# Patient Record
Sex: Female | Born: 1990 | Hispanic: No | Marital: Single | Smoking: Former smoker
Health system: Southern US, Community
[De-identification: ages and names within clinical notes are randomized; demographics above are authoritative.]

## PROBLEM LIST (undated history)

## (undated) DIAGNOSIS — G43909 Migraine, unspecified, not intractable, without status migrainosus: Secondary | ICD-10-CM

## (undated) DIAGNOSIS — F329 Major depressive disorder, single episode, unspecified: Secondary | ICD-10-CM

## (undated) HISTORY — DX: Major depressive disorder, single episode, unspecified: F32.9

## (undated) HISTORY — DX: Migraine, unspecified, not intractable, without status migrainosus: G43.909

---
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
## 2012-04-26 ENCOUNTER — Ambulatory Visit (INDEPENDENT_AMBULATORY_CARE_PROVIDER_SITE_OTHER): Payer: BC Managed Care – PPO | Admitting: Family Medicine

## 2012-04-26 ENCOUNTER — Encounter: Payer: Self-pay | Admitting: Family Medicine

## 2012-04-26 VITALS — BP 110/73 | HR 72 | Wt 108.0 lb

## 2012-04-26 DIAGNOSIS — Z0000 Encounter for general adult medical examination without abnormal findings: Secondary | ICD-10-CM

## 2012-04-26 DIAGNOSIS — E049 Nontoxic goiter, unspecified: Secondary | ICD-10-CM

## 2012-04-26 DIAGNOSIS — R5383 Other fatigue: Secondary | ICD-10-CM

## 2012-04-26 DIAGNOSIS — R5381 Other malaise: Secondary | ICD-10-CM

## 2012-04-26 LAB — T3, FREE: T3, Free: 2.8 pg/mL (ref 2.3–4.2)

## 2012-04-26 LAB — COMPLETE METABOLIC PANEL WITH GFR
Albumin: 4.4 g/dL (ref 3.5–5.2)
Alkaline Phosphatase: 30 U/L — ABNORMAL LOW (ref 39–117)
BUN: 9 mg/dL (ref 6–23)
CO2: 23 mEq/L (ref 19–32)
GFR, Est African American: >89 mL/min
GFR, Est Non African American: >89 mL/min
Glucose, Bld: 72 mg/dL (ref 70–99)
Potassium: 4.5 mEq/L (ref 3.5–5.3)
Total Bilirubin: 0.5 mg/dL (ref 0.3–1.2)

## 2012-04-26 LAB — CBC
MCH: 29.3 pg (ref 26.0–34.0)
Platelets: 230 10*3/uL (ref 150–400)
RBC: 4.68 MIL/uL (ref 3.87–5.11)
RDW: 12.9 % (ref 11.5–15.5)

## 2012-04-26 LAB — FERRITIN: Ferritin: 13 ng/mL (ref 10–291)

## 2012-04-26 NOTE — Progress Notes (Signed)
Subjective:    Patient ID: Candice Callahan, female    DOB: 18-Oct-1990, 21 y.o.   MRN: [id number]  HPI Herer for CPE today.    Goes to bed around 11PM. Still sleeps with the TV on.  Sleeping well. Says yawns a lot.  Takes naps most days.  Doesn't feel rested when wakes up. Drinks about 3 cups of caffine a day.  Mom with hx of thyrodi problems.  No heavy period. They are regular.  Not vegetarian.  No snoring.  Sometimes easily irritated but denies feeling depressed.  Not a smoker. Takes abourt a 2 hour nap daily.  No CP or SOb. No palpitations.  No skin or hair changes. Does shed a lot.  No swollen LNs.    Review of Systems comprehesive ROS is neg except for HPI.     BP 110/73  Pulse 72  Wt 108 lb (48.988 kg)  LMP 04/11/2012    No Known Allergies  No past medical history on file.  No past surgical history on file.  History   Social History  . Marital Status: Single    Spouse Name: N/A    Number of Children: N/A  . Years of Education: N/A   Occupational History  . Not on file.   Social History Main Topics  . Smoking status: Never Smoker   . Smokeless tobacco: Not on file  . Alcohol Use: Not on file  . Drug Use: Not on file  . Sexually Active: Not on file   Other Topics Concern  . Not on file   Social History Narrative  . No narrative on file    No family history on file.  Outpatient Encounter Prescriptions as of 04/26/2012  Medication Sig Dispense Refill  . etonogestrel-ethinyl estradiol (NUVARING) 0.12-0.015 MG/24HR vaginal ring Place 1 each vaginally every 28 (twenty-eight) days. Insert vaginally and leave in place for 3 consecutive weeks, then remove for 1 week.      . butalbital-acetaminophen-caffeine (FIORICET) 50-325-40 MG per tablet Take 1-2 tablets by mouth every 6 (six) hours as needed for pain or headache (headache pain can cause sedatioin).  20 tablet  0  . orphenadrine (NORFLEX) 100 MG tablet Take 1 tablet (100 mg total) by mouth 2 (two) times daily as  needed for muscle spasms (mainly take at night since it can cause sedation).  60 tablet  3       Objective:   Physical Exam  Constitutional: She is oriented to person, place, and time. She appears well-developed and well-nourished.  HENT:  Head: Normocephalic and atraumatic.  Right Ear: External ear normal.  Left Ear: External ear normal.  Nose: Nose normal.  Mouth/Throat: Oropharynx is clear and moist.       TMs and canals are clear.   Eyes: Conjunctivae and EOM are normal. Pupils are equal, round, and reactive to light.  Neck: Neck supple. Thyromegaly present.       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  Cardiovascular: Normal rate, regular rhythm and normal heart sounds.   Pulmonary/Chest: Effort normal and breath sounds normal. She has no wheezes.  Abdominal: Soft. Bowel sounds are normal. She exhibits no distension and no mass. There is no tenderness. There is no rebound and no guarding.  Musculoskeletal: She exhibits no edema.  Lymphadenopathy:    She has no cervical adenopathy.  Neurological: She is alert and oriented to person, place, and time. She has normal reflexes. No cranial nerve deficit. Coordination normal.  Skin: Skin is warm and dry.  Psychiatric: She has a normal mood and affect. Her behavior is normal.          Assessment & Plan:  complete physical examination - Start a regular exercise program and make sure you are eating a healthy diet Try to eat 4 servings of dairy a day  Your vaccines are up to date.   Thyromegaly - recheck thyroid panel today. She does have a large thyroid but feels symmetric and I don't palpate any nodules. She also has a family history of hypothyroidism. Mom was diagnosed around age 36. Certainly this could explain her fatigue.  Fatigue - could be related to her thyroid. Recheck thyroid panel. Check CBC to evaluate for anemia though she denies any heavy periods. She is not vegetarian but we will go ahead and check for B12  deficiency. I did recommend cutting back on her caffeine intake and trying to set a regular bedtime and awake time. I reminded her as we have discussed in the past to not use TV to fall asleep.

## 2012-04-26 NOTE — Patient Instructions (Addendum)
We will call you with your lab results. If you don't here from us in about a week then please give us a call at [phone].  

## 2012-04-27 LAB — VITAMIN D 25 HYDROXY (VIT D DEFICIENCY, FRACTURES): Vit D, 25-Hydroxy: 33 ng/mL (ref 30–89)

## 2012-05-02 ENCOUNTER — Encounter: Payer: Self-pay | Admitting: *Deleted

## 2012-09-04 ENCOUNTER — Telehealth: Payer: Self-pay | Admitting: Family Medicine

## 2012-09-04 NOTE — Telephone Encounter (Signed)
Unable to leave a message. Voice mail has not been set up.  

## 2012-09-04 NOTE — Telephone Encounter (Signed)
Patient's mom called and left a voice message and request to have referral for Orthopedic Dr. Vevelyn Royals name is Candice Callahan and request to have a call back regarding (daughter getting a referral). Thanks  Not sure what its in reference to or if you have discussed it in previous visit.

## 2012-09-06 NOTE — Telephone Encounter (Signed)
Called and was unable to leave a message

## 2013-11-19 ENCOUNTER — Ambulatory Visit (INDEPENDENT_AMBULATORY_CARE_PROVIDER_SITE_OTHER): Payer: BC Managed Care – PPO | Admitting: Family Medicine

## 2013-11-19 ENCOUNTER — Encounter: Payer: Self-pay | Admitting: Family Medicine

## 2013-11-19 VITALS — BP 116/65 | HR 87 | Temp 98.3°F | Ht 63.0 in | Wt 107.0 lb

## 2013-11-19 DIAGNOSIS — F32A Depression, unspecified: Secondary | ICD-10-CM

## 2013-11-19 DIAGNOSIS — F329 Major depressive disorder, single episode, unspecified: Secondary | ICD-10-CM

## 2013-11-19 MED ORDER — FLUOXETINE HCL 10 MG PO CAPS: ORAL_CAPSULE | ORAL | Status: DC

## 2013-11-19 NOTE — Progress Notes (Signed)
   Subjective:    Patient ID: Candice Callahan, female    DOB: December 10, 1990, 23 y.o.   MRN: [id number]  HPI  Here to discuss mood.  Says has been a long time since generally happy. Has never been treated for depression. She has had thought of not being her  . Does live with her mom. She is working. No major event or stressor.  No regular exercise.  C/o of feeling down.  Having problems sleeping as well. Has been irritable as well.  Gets annoyed at work.  No fam hx of depression. Never been dx or treated before.  No worsening or alleviating factors.    Review of Systems     Objective:   Physical Exam  Constitutional: She is oriented to person, place, and time. She appears well-developed and well-nourished.  HENT:  Head: Normocephalic and atraumatic.  Cardiovascular: Normal rate, regular rhythm and normal heart sounds.   Pulmonary/Chest: Effort normal and breath sounds normal.  Neurological: She is alert and oriented to person, place, and time.  Skin: Skin is warm and dry.  Psychiatric: She has a normal mood and affect. Her behavior is normal.          Assessment & Plan:  Acute depression - PHQ- 9 score of 10 today. New dx of modera depression. Discussed tx options of counseling, Rx medication, and exercise. She wants to start medication. Will start Prozac. Discussed potential S.E. F/U in 3-4 weeks. Call if any problems.  Consider counseling. She can call if would like referra.

## 2013-12-10 ENCOUNTER — Encounter: Payer: Self-pay | Admitting: Family Medicine

## 2013-12-10 ENCOUNTER — Ambulatory Visit (INDEPENDENT_AMBULATORY_CARE_PROVIDER_SITE_OTHER): Payer: BC Managed Care – PPO | Admitting: Family Medicine

## 2013-12-10 ENCOUNTER — Ambulatory Visit: Payer: BC Managed Care – PPO | Admitting: Family Medicine

## 2013-12-10 VITALS — BP 117/76 | HR 76 | Wt 102.0 lb

## 2013-12-10 DIAGNOSIS — F32A Depression, unspecified: Secondary | ICD-10-CM

## 2013-12-10 DIAGNOSIS — F329 Major depressive disorder, single episode, unspecified: Secondary | ICD-10-CM

## 2013-12-10 HISTORY — DX: Depression, unspecified: F32.A

## 2013-12-10 MED ORDER — FLUOXETINE HCL 20 MG PO CAPS: 20.0000 mg | ORAL_CAPSULE | Freq: Every day | ORAL | Status: DC

## 2013-12-10 NOTE — Progress Notes (Signed)
   Subjective:    Patient ID: Candice Callahan, female    DOB: 10-Jul-1991, 23 y.o.   MRN: [id number]  HPI Depression - doing well on the fluoxetine. Had one back HA initially but none since thme.  Had a couple night didn't sleep well but ok now.  She has been much less irritable at work. Has been sleeping well. He denies feeling significantly down or depressed her weight herself. She's not had any negative side effects of the medication. Still does complain of some fatigue and difficulty concentrating.   Review of Systems     Objective:   Physical Exam  Constitutional: She is oriented to person, place, and time. She appears well-developed and well-nourished.  HENT:  Head: Normocephalic and atraumatic.  Cardiovascular: Normal rate, regular rhythm and normal heart sounds.   Pulmonary/Chest: Effort normal and breath sounds normal.  Neurological: She is alert and oriented to person, place, and time.  Skin: Skin is warm and dry.  Psychiatric: She has a normal mood and affect. Her behavior is normal.          Assessment & Plan:  Depression - continue current regimen. We'll change fluoxetine to 20 mg capsules so she doesn't have to take 2 of the 10 mg capsules. Followup in 2 months to touch base. Recommend that she stay on this regimen for at least 6 months. Call if any palms side effects or concerns. Depression is well-controlled today with a PHQ 9 score of 3.

## 2013-12-11 ENCOUNTER — Ambulatory Visit: Payer: BC Managed Care – PPO | Admitting: Family Medicine

## 2014-03-12 ENCOUNTER — Emergency Department
Admission: EM | Admit: 2014-03-12 | Discharge: 2014-03-12 | Disposition: A | Discharge status: Home / Self Care | Payer: BC Managed Care – PPO | Source: Home / Self Care | Attending: Family Medicine | Admitting: Family Medicine

## 2014-03-12 ENCOUNTER — Encounter: Payer: Self-pay | Admitting: Emergency Medicine

## 2014-03-12 DIAGNOSIS — L02419 Cutaneous abscess of limb, unspecified: Secondary | ICD-10-CM

## 2014-03-12 DIAGNOSIS — T63443A Toxic effect of venom of bees, assault, initial encounter: Secondary | ICD-10-CM

## 2014-03-12 DIAGNOSIS — Z23 Encounter for immunization: Secondary | ICD-10-CM

## 2014-03-12 DIAGNOSIS — L03115 Cellulitis of right lower limb: Secondary | ICD-10-CM

## 2014-03-12 DIAGNOSIS — T6391XA Toxic effect of contact with unspecified venomous animal, accidental (unintentional), initial encounter: Secondary | ICD-10-CM

## 2014-03-12 DIAGNOSIS — L03119 Cellulitis of unspecified part of limb: Secondary | ICD-10-CM

## 2014-03-12 MED ORDER — TETANUS-DIPHTH-ACELL PERTUSSIS 5-2.5-18.5 LF-MCG/0.5 IM SUSP
0.5000 mL | Freq: Once | INTRAMUSCULAR | Status: AC
  Administered 2014-03-12: 0.5 mL via INTRAMUSCULAR

## 2014-03-12 MED ORDER — DOXYCYCLINE HYCLATE 100 MG PO CAPS: 100.0000 mg | ORAL_CAPSULE | Freq: Two times a day (BID) | ORAL | Status: DC

## 2014-03-12 NOTE — Discharge Instructions (Signed)
May continue Benadryl as needed for itching. Return for increased redness, pain, swelling, fever, etc.   Bee, Wasp, or Hornet Sting Your caregiver has diagnosed you as having an insect sting. An insect sting appears as a red lump in the skin that sometimes has a tiny hole in the center, or it may have a stinger in the center of the wound. The most common stings are from wasps, hornets and bees. Individuals have different reactions to insect stings.  A normal reaction may cause pain, swelling, and redness around the sting site.  A localized allergic reaction may cause swelling and redness that extends beyond the sting site.  A large local reaction may continue to develop over the next 12 to 36 hours.  On occasion, the reactions can be severe (anaphylactic reaction). An anaphylactic reaction may cause wheezing; difficulty breathing; chest pain; fainting; raised, itchy, red patches on the skin; a sick feeling to your stomach (nausea); vomiting; cramping; or diarrhea. If you have had an anaphylactic reaction to an insect sting in the past, you are more likely to have one again. HOME CARE INSTRUCTIONS   With bee stings, a small sac of poison is left in the wound. Brushing across this with something such as a credit card, or anything similar, will help remove this and decrease the amount of the reaction. This same procedure will not help a wasp sting as they do not leave behind a stinger and poison sac.  Apply a cold compress for 10 to 20 minutes every hour for 1 to 2 days, depending on severity, to reduce swelling and itching.  To lessen pain, a paste made of water and baking soda may be rubbed on the bite or sting and left on for 5 minutes.  To relieve itching and swelling, you may use take medication or apply medicated creams or lotions as directed.  Only take over-the-counter or prescription medicines for pain, discomfort, or fever as directed by your caregiver.  Wash the sting site daily  with soap and water. Apply antibiotic ointment on the sting site as directed.  If you suffered a severe reaction:  If you did not require hospitalization, an adult will need to stay with you for 24 hours in case the symptoms return.  You may need to wear a medical bracelet or necklace stating the allergy.  You and your family need to learn when and how to use an anaphylaxis kit or epinephrine injection.  If you have had a severe reaction before, always carry your anaphylaxis kit with you. SEEK MEDICAL CARE IF:   None of the above helps within 2 to 3 days.  The area becomes red, warm, tender, and swollen beyond the area of the bite or sting.  You have an oral temperature above 102 F (38.9 C). SEEK IMMEDIATE MEDICAL CARE IF:  You have symptoms of an allergic reaction which are:  Wheezing.  Difficulty breathing.  Chest pain.  Lightheadedness or fainting.  Itchy, raised, red patches on the skin.  Nausea, vomiting, cramping or diarrhea. ANY OF THESE SYMPTOMS MAY REPRESENT A SERIOUS PROBLEM THAT IS AN EMERGENCY. Do not wait to see if the symptoms will go away. Get medical help right away. Call your local emergency services (911 in [place] DO NOT drive yourself to the hospital. MAKE SURE YOU:   Understand these instructions.  Will watch your condition.  Will get help right away if you are not doing well or get worse. Document Released: 08/09/2005 Document Revised: 11/01/2011 Document  Reviewed: 01/24/2010 Exit[employer] Patient Information 2015 [place], [place]. This information is not intended to replace advice given to you by your health care provider. Make sure you discuss any questions you have with your health care provider.

## 2014-03-12 NOTE — ED Provider Notes (Signed)
CSN: [id number]     Arrival date & time 03/12/14  1423 History   First MD Initiated Contact with Patient 03/12/14 1442     Chief Complaint  Patient presents with  . Insect Bite      HPI Comments: Patient was stung by a bee on her right posterior calf 48 hours ago.  Since then she has developed increased redness and swelling, and now has redness extending to her right lateral ankle.  The areas itch but are not painful.  She denies calf pain or tenderness.  No fevers, chills, and sweats.  She has had no wheezing or shortness of breath.  She does not remember her last tetanus shot.  Patient is a 23 y.o. female presenting with trauma. The history is provided by the patient.  Trauma Mechanism of injury: bee sting Injury location: leg Injury location detail: R lower leg Time since incident: 2 days   Current symptoms:      Quality: itching.      Pain timing: constant      Associated symptoms:            Denies chest pain, difficulty breathing, nausea and vomiting.   Relevant PMH:      Tetanus status: out of date   History reviewed. No pertinent past medical history. History reviewed. No pertinent past surgical history. No family history on file. History  Substance Use Topics  . Smoking status: Former Games developer  . Smokeless tobacco: Not on file  . Alcohol Use: Yes   OB History   Grav Para Term Preterm Abortions TAB SAB Ect Mult Living                 Review of Systems  Constitutional: Negative for fever, chills and fatigue.  Cardiovascular: Negative for chest pain.  Gastrointestinal: Negative for nausea and vomiting.  All other systems reviewed and are negative.   Allergies  Review of patient's allergies indicates no known allergies.  Home Medications   Prior to Admission medications   Medication Sig Start Date End Date Taking? Authorizing Provider  doxycycline (VIBRAMYCIN) 100 MG capsule Take 1 capsule (100 mg total) by mouth 2 (two) times daily. Take with food. 03/12/14    Lattie Haw, MD  etonogestrel-ethinyl estradiol (NUVARING) 0.12-0.015 MG/24HR vaginal ring Place 1 each vaginally every 28 (twenty-eight) days. Insert vaginally and leave in place for 3 consecutive weeks, then remove for 1 week.    Historical Provider, MD  FLUoxetine (PROZAC) 20 MG capsule Take 1 capsule (20 mg total) by mouth daily. One a day for 5 days and then 2 a day. 12/10/13   Agapito Games, MD   BP 113/75  Pulse 77  Temp(Src) 98.4 F (36.9 C) (Oral)  Ht 5\' 3"  (1.6 m)  Wt 109 lb (49.442 kg)  BMI 19.31 kg/m2  SpO2 100% Physical Exam  Nursing note and vitals reviewed. Constitutional: She is oriented to person, place, and time. She appears well-developed and well-nourished. No distress.  HENT:  Head: Atraumatic.  Eyes: Conjunctivae are normal. Pupils are equal, round, and reactive to light.  Musculoskeletal:       Right lower leg: She exhibits swelling. She exhibits no tenderness and no edema.       Legs: Right posterior calf has a 6cm diameter nummular erythematous region as noted on diagram.  There is faint erythema extending from the sting site to right lateral ankle.  The area is slightly warm and minimally swollen, but not tender to palpation.  No calf tenderness is present, and Homan's test is negative.   Neurological: She is alert and oriented to person, place, and time.  Skin: Skin is warm and dry.    ED Course  Procedures  none      MDM   1. Bee sting, assault, initial encounter   2. Cellulitis of leg, right    Tdap administered Begin doxycycline. May continue Benadryl as needed for itching. Return for increased redness, pain, swelling, fever, etc. Followup with Family Doctor if not improved in about 3 days.    Lattie HawStephen A Beese, MD 03/12/14 [phone]

## 2014-03-12 NOTE — ED Notes (Signed)
Bee sting on right lower leg, cellulitis, red, swollen, itches and feels like a bruise all the way down to her foot x 3 days

## 2014-03-15 ENCOUNTER — Telehealth: Payer: Self-pay

## 2014-03-15 NOTE — ED Notes (Signed)
Left a message on voice mail asking how patient is feeling and advising to call back with any questions or concerns.  

## 2014-05-07 ENCOUNTER — Encounter: Payer: Self-pay | Admitting: Family Medicine

## 2014-05-07 ENCOUNTER — Ambulatory Visit (INDEPENDENT_AMBULATORY_CARE_PROVIDER_SITE_OTHER): Payer: BC Managed Care – PPO | Admitting: Family Medicine

## 2014-05-07 VITALS — BP 116/89 | HR 89 | Wt 109.0 lb

## 2014-05-07 DIAGNOSIS — R41840 Attention and concentration deficit: Secondary | ICD-10-CM | POA: Diagnosis not present

## 2014-05-07 DIAGNOSIS — F3289 Other specified depressive episodes: Secondary | ICD-10-CM | POA: Diagnosis not present

## 2014-05-07 DIAGNOSIS — F329 Major depressive disorder, single episode, unspecified: Secondary | ICD-10-CM

## 2014-05-07 DIAGNOSIS — F32A Depression, unspecified: Secondary | ICD-10-CM

## 2014-05-07 MED ORDER — FLUOXETINE HCL 40 MG PO CAPS: 40.0000 mg | ORAL_CAPSULE | Freq: Every day | ORAL | Status: DC

## 2014-05-07 NOTE — Progress Notes (Signed)
   Subjective:    Patient ID: Candice Callahan, female    DOB: [birth year], 23 y.o.   MRN: [id number]  HPI Here for depression.  She's here today to followup for depression. She feels like the fluoxetine worked well initially but feels like some of the effects have waned. She's currently taking 20 mg daily. She denies any side effects of the medication. She has been struggling some with attentiveness during her classes. She complains of feeling tired and having difficulty concentrating. She does feel down and depressed several days. Negative for all other symptoms.  Feels like having problems with inattention. She is in college classes and is really strugling. Even at work will forget to do things she just got asked to do.  She feels like she is struggling with attention most of her life. She denies any significant hyperactivity symptoms. Her grades are typically average. She says she will often do well on sure assignments but when it comes to testing she tends to not do well.   Review of Systems     Objective:   Physical Exam  Constitutional: She is oriented to person, place, and time. She appears well-developed and well-nourished.  HENT:  Head: Normocephalic and atraumatic.  Cardiovascular: Normal rate, regular rhythm and normal heart sounds.   Pulmonary/Chest: Effort normal and breath sounds normal.  Neurological: She is alert and oriented to person, place, and time.  Skin: Skin is warm and dry.  Psychiatric: She has a normal mood and affect. Her behavior is normal.          Assessment & Plan:  Depression - previous PHQ 9 score of 3 is up to 5. We discussed different options. Would like to try increasing her fluoxetine to 40 mg. She has any side effects or problems please calls back. Otherwise followup in 3 months.  Inattention - I. did have her do an adult ADHD RS adhd-rs-IV pt scored equally in the moderate and severe inattentive symptomes and none in the hyperactive impulsive  symptoms. This is significant enough that I would recommend further evaluation. We'll place a referral to be evaluated by behavioral health over at [facility] for adult ADD. She screened positive then I will see her back and we discussed can discuss therapeutic and treatment options.

## 2014-05-31 ENCOUNTER — Ambulatory Visit (INDEPENDENT_AMBULATORY_CARE_PROVIDER_SITE_OTHER): Payer: BC Managed Care – PPO | Admitting: Psychology

## 2014-05-31 DIAGNOSIS — F320 Major depressive disorder, single episode, mild: Secondary | ICD-10-CM

## 2014-05-31 DIAGNOSIS — F900 Attention-deficit hyperactivity disorder, predominantly inattentive type: Secondary | ICD-10-CM

## 2014-06-14 ENCOUNTER — Ambulatory Visit (INDEPENDENT_AMBULATORY_CARE_PROVIDER_SITE_OTHER): Payer: BC Managed Care – PPO | Admitting: Psychology

## 2014-06-14 DIAGNOSIS — F900 Attention-deficit hyperactivity disorder, predominantly inattentive type: Secondary | ICD-10-CM

## 2014-06-14 DIAGNOSIS — F332 Major depressive disorder, recurrent severe without psychotic features: Secondary | ICD-10-CM

## 2014-06-19 ENCOUNTER — Ambulatory Visit (INDEPENDENT_AMBULATORY_CARE_PROVIDER_SITE_OTHER): Payer: BC Managed Care – PPO | Admitting: Psychology

## 2014-06-19 DIAGNOSIS — F900 Attention-deficit hyperactivity disorder, predominantly inattentive type: Secondary | ICD-10-CM

## 2014-06-19 DIAGNOSIS — F320 Major depressive disorder, single episode, mild: Secondary | ICD-10-CM

## 2014-07-03 ENCOUNTER — Ambulatory Visit (INDEPENDENT_AMBULATORY_CARE_PROVIDER_SITE_OTHER): Payer: BC Managed Care – PPO | Admitting: Psychology

## 2014-07-03 DIAGNOSIS — F330 Major depressive disorder, recurrent, mild: Secondary | ICD-10-CM

## 2014-08-03 ENCOUNTER — Other Ambulatory Visit: Payer: Self-pay | Admitting: Family Medicine

## 2014-08-05 NOTE — Telephone Encounter (Signed)
Appointment needed before future refills 

## 2014-08-30 ENCOUNTER — Other Ambulatory Visit: Payer: Self-pay | Admitting: Family Medicine

## 2014-09-30 ENCOUNTER — Other Ambulatory Visit: Payer: Self-pay | Admitting: Family Medicine

## 2014-10-26 ENCOUNTER — Other Ambulatory Visit: Payer: Self-pay | Admitting: Family Medicine

## 2014-11-10 ENCOUNTER — Other Ambulatory Visit: Payer: Self-pay | Admitting: Family Medicine

## 2014-11-11 ENCOUNTER — Encounter: Payer: Self-pay | Admitting: Family Medicine

## 2014-11-11 ENCOUNTER — Ambulatory Visit (INDEPENDENT_AMBULATORY_CARE_PROVIDER_SITE_OTHER): Payer: Self-pay | Admitting: Family Medicine

## 2014-11-11 VITALS — BP 116/76 | HR 78 | Ht 63.0 in | Wt 123.0 lb

## 2014-11-11 DIAGNOSIS — F329 Major depressive disorder, single episode, unspecified: Secondary | ICD-10-CM

## 2014-11-11 DIAGNOSIS — F32A Depression, unspecified: Secondary | ICD-10-CM

## 2014-11-11 MED ORDER — FLUOXETINE HCL 40 MG PO CAPS: ORAL_CAPSULE | ORAL | Status: DC

## 2014-11-11 NOTE — Progress Notes (Signed)
   Subjective:    Patient ID: Candice Callahan, female    DOB: 05-14-1991, [age] y.o.   MRN: [id number]  HPI Follow-up depression-patient only complains of low energy today. Denies symptoms of feeling down or little interest in doing things. She was started on fluoxetine exactly a year ago in March 2015. She was eventually titrated to 40 mg in December.  Sleep quality is good.  Feels like her stress level might go up  bc she got a promotion. No negative effects.    Review of Systems     Objective:   Physical Exam  Constitutional: She is oriented to person, place, and time. She appears well-developed and well-nourished.  HENT:  Head: Normocephalic and atraumatic.  Cardiovascular: Normal rate, regular rhythm and normal heart sounds.   Pulmonary/Chest: Effort normal and breath sounds normal.  Neurological: She is alert and oriented to person, place, and time.  Skin: Skin is warm and dry.  Psychiatric: She has a normal mood and affect. Her behavior is normal.          Assessment & Plan:  Depression-previous PHQ 9 score 5, down to 1 today. She rates her symptoms as not difficult at all.  We discussed continuing versus weaning since has been on it for a year.  She prefers to stay on the medication her current regimen. I'll see her back in 6 months. Certainly if at some point she is willing to come off her taper and she can always come in sooner.

## 2015-05-19 ENCOUNTER — Ambulatory Visit (INDEPENDENT_AMBULATORY_CARE_PROVIDER_SITE_OTHER): Payer: BLUE CROSS/BLUE SHIELD | Admitting: Family Medicine

## 2015-05-19 ENCOUNTER — Encounter: Payer: Self-pay | Admitting: Family Medicine

## 2015-05-19 VITALS — BP 104/54 | HR 56 | Ht 63.0 in | Wt 129.0 lb

## 2015-05-19 DIAGNOSIS — F324 Major depressive disorder, single episode, in partial remission: Secondary | ICD-10-CM

## 2015-05-19 DIAGNOSIS — F325 Major depressive disorder, single episode, in full remission: Secondary | ICD-10-CM

## 2015-05-19 MED ORDER — FLUOXETINE HCL 10 MG PO CAPS: ORAL_CAPSULE | ORAL | Status: DC

## 2015-05-19 NOTE — Progress Notes (Signed)
   Subjective:    Patient ID: Candice Callahan, female    DOB: [birth year], 24 y.o.   MRN: [id number]  HPI She is here today to follow-up on depression. She is doing fantastic. She is now working 2 jobs. She denies any depressive symptoms. She does complain of a little bit of low energy. Mood has been great. She's tolerating the fluoxetine well and is currently taking 40 mg daily. We first started treating her in March 2015.   Review of Systems     Objective:   Physical Exam  Constitutional: She is oriented to person, place, and time. She appears well-developed and well-nourished.  HENT:  Head: Normocephalic and atraumatic.  Eyes: Conjunctivae and EOM are normal.  Cardiovascular: Normal rate.   Pulmonary/Chest: Effort normal.  Neurological: She is alert and oriented to person, place, and time.  Skin: Skin is dry. No pallor.  Psychiatric: She has a normal mood and affect. Her behavior is normal.          Assessment & Plan:  Depression, in remission-she's done fantastic. We will taper her off the fluoxetine over the next 3 weeks. Did encourage her to call at any point if she feels like she's not doing well on the taper. We cannot extend it out longer if needed. Or we can also keep her on a lower dose if needed.  Time spent 20 min, > 50% spent counseling about depression and cessation of medication.

## 2015-07-19 ENCOUNTER — Other Ambulatory Visit: Payer: Self-pay | Admitting: Family Medicine

## 2015-07-22 ENCOUNTER — Other Ambulatory Visit: Payer: Self-pay

## 2015-07-22 MED ORDER — FLUOXETINE HCL 10 MG PO CAPS: ORAL_CAPSULE | ORAL | Status: DC

## 2015-10-30 ENCOUNTER — Encounter: Payer: Self-pay | Admitting: Family Medicine

## 2015-10-30 ENCOUNTER — Ambulatory Visit (INDEPENDENT_AMBULATORY_CARE_PROVIDER_SITE_OTHER): Payer: BLUE CROSS/BLUE SHIELD | Admitting: Family Medicine

## 2015-10-30 VITALS — BP 105/68 | HR 73 | Temp 98.5°F | Wt 118.0 lb

## 2015-10-30 DIAGNOSIS — R509 Fever, unspecified: Secondary | ICD-10-CM

## 2015-10-30 DIAGNOSIS — M542 Cervicalgia: Secondary | ICD-10-CM

## 2015-10-30 MED ORDER — SUMATRIPTAN SUCCINATE 100 MG PO TABS: 100.0000 mg | ORAL_TABLET | ORAL | Status: DC | PRN

## 2015-10-30 NOTE — Progress Notes (Signed)
   Subjective:    Patient ID: Candice Callahan, female    DOB: 1990-10-23, 25 y.o.   MRN: [id number]  HPI  She woke up Sunday morning not feeling well. She felt like she had some pain in her upper back and her neck. She started running low-grade fevers. The highest fever she had was 101 and her last fever was yesterday. Today she says she actually feels better. Her neck and back feel better and she has not run a temperature yet today. She denies any cough sinus congestion, urinary symptoms, or lower back pain. No GI symptoms.  She also wants to discuss some upper neck pain. She says it's actually been going on for years. It seems to be episodic and can happen once or twice per month. It typically comes on suddenly. She gets pain at the base of the skull typically on the right side but occasionally can be on the left. She says it so intense when it happens it sometimes she actually has to leave work and sometimes she actually gets nauseated and vomits. No significant light sensitivity or sound sensitivity.. She says sometimes it does provide some relief. Typically the headache will last about a day. Rarely it will last 2-3 days at a time.  Review of Systems     Objective:   Physical Exam  Constitutional: She is oriented to person, place, and time. She appears well-developed and well-nourished.  HENT:  Head: Normocephalic and atraumatic.  Right Ear: External ear normal.  Left Ear: External ear normal.  Nose: Nose normal.  Mouth/Throat: Oropharynx is clear and moist.  TMs and canals are clear.   Eyes: Conjunctivae and EOM are normal. Pupils are equal, round, and reactive to light.  Neck: Neck supple. No thyromegaly present.  Cardiovascular: Normal rate, regular rhythm and normal heart sounds.   Pulmonary/Chest: Effort normal and breath sounds normal. She has no wheezes.  Abdominal: Soft. Bowel sounds are normal. She exhibits no distension and no mass. There is no tenderness. There is no rebound  and no guarding.  Musculoskeletal:  Cervical spine with normal flexion extension rotation right and left and side bending. Nontender over the cervical spine and nontender over the paraspinous muscles.  Lymphadenopathy:    She has no cervical adenopathy.  Neurological: She is alert and oriented to person, place, and time.  Skin: Skin is warm and dry.  Psychiatric: She has a normal mood and affect.          Assessment & Plan:  Fever-unclear etiology. Most likely viral but she never really expressing upper respiratory symptoms. She is feeling better today and has a completely normal physical exam. I did go ahead and give her a lab slip in case she starts to feel bad again in the next report a 48 hours she can go the lab and we can at least check a CBC, kidney function liver enzymes and a urinalysis. Consider chest x-ray if fever recurs as well.  Upper neck pain-relieving by her description sounds more consistent with migraine headaches. I would like to try a triptan to see if it's helpful and if it resolves her symptoms and works better than the Advil. Follow-up in 2-3 months.

## 2015-10-30 NOTE — Patient Instructions (Signed)

## 2015-11-10 ENCOUNTER — Telehealth: Payer: Self-pay

## 2015-11-10 MED ORDER — RIZATRIPTAN BENZOATE 10 MG PO TBDP: 10.0000 mg | ORAL_TABLET | ORAL | Status: DC | PRN

## 2015-11-10 NOTE — Telephone Encounter (Signed)
We can try maxalt. New rx sent.

## 2015-11-10 NOTE — Telephone Encounter (Signed)
Notified pt. 

## 2017-06-13 DIAGNOSIS — Z975 Presence of (intrauterine) contraceptive device: Secondary | ICD-10-CM | POA: Diagnosis not present

## 2017-06-13 DIAGNOSIS — Z803 Family history of malignant neoplasm of breast: Secondary | ICD-10-CM | POA: Diagnosis not present

## 2017-06-13 DIAGNOSIS — Z01419 Encounter for gynecological examination (general) (routine) without abnormal findings: Secondary | ICD-10-CM | POA: Diagnosis not present

## 2017-06-24 DIAGNOSIS — Z30430 Encounter for insertion of intrauterine contraceptive device: Secondary | ICD-10-CM | POA: Diagnosis not present

## 2017-06-24 DIAGNOSIS — Z30433 Encounter for removal and reinsertion of intrauterine contraceptive device: Secondary | ICD-10-CM | POA: Diagnosis not present

## 2017-07-07 ENCOUNTER — Ambulatory Visit: Payer: BLUE CROSS/BLUE SHIELD | Admitting: Osteopathic Medicine

## 2017-07-07 VITALS — BP 117/78 | HR 73 | Temp 98.4°F | Ht 63.0 in | Wt 106.0 lb

## 2017-07-07 DIAGNOSIS — R8290 Unspecified abnormal findings in urine: Secondary | ICD-10-CM

## 2017-07-07 DIAGNOSIS — M549 Dorsalgia, unspecified: Secondary | ICD-10-CM | POA: Diagnosis not present

## 2017-07-07 LAB — POCT URINALYSIS DIPSTICK
GLUCOSE UA: NEGATIVE
Leukocytes, UA: NEGATIVE
NITRITE UA: NEGATIVE
Spec Grav, UA: >=1.03 — AB (ref 1.010–1.025)
UROBILINOGEN UA: 0.2 U/dL
pH, UA: 5.5 (ref 5.0–8.0)

## 2017-07-07 NOTE — Progress Notes (Signed)
HPI: Candice Callahan is a 26 y.o. female who  has no past medical history on file.  [facility], 07/07/17,  for chief complaint of:  Chief Complaint  Patient presents with  . Back Pain    Kyleena put in recently 06/24/17, since then back soreness low back on and off, no vaginal bleeding at this point, reports orange colored urine without frequency or dysuria. No concern for dehydration. She called her OBGYN there was some suggestion might be kidney stones or urinary issues.    Past medical, surgical, social and family history reviewed:  Patient Active Problem List   Diagnosis Date Noted  . Acute depression 12/10/2013  . THYROMEGALY 12/09/2008  . NECK PAIN 12/09/2008  . HEADACHE 12/09/2008    No past surgical history on file.  Social History   Tobacco Use  . Smoking status: Former Smoker  Substance Use Topics  . Alcohol use: Yes    No family history on file.   Current medication list and allergy/intolerance information reviewed:    Current Outpatient Medications  Medication Sig Dispense Refill  . Levonorgestrel (KYLEENA) 19.5 MG IUD by Intrauterine route.    . rizatriptan (MAXALT-MLT) 10 MG disintegrating tablet Take 1 tablet (10 mg total) by mouth as needed for migraine. May repeat in 2 hours if needed 9 tablet 1   No current facility-administered medications for this visit.     No Known Allergies    Review of Systems:  Constitutional:  No  fever, no chills, No recent illness, No unintentional weight changes. No significant fatigue.   Cardiac: No  chest pain,  Respiratory:  No  shortness of breath  Gastrointestinal: No  abdominal pain, No  nausea, No  vomiting,  No  blood in stool, No  diarrhea, No  constipation   Musculoskeletal: No new myalgia/arthralgia, +back pain as per HPI  Genitourinary: No  incontinence, No  abnormal genital bleeding, No abnormal genital discharge  Skin: No  Rash  Neurologic:  No  weakness, No  dizziness  Psychiatric: No  concerns with depression, No  concerns with anxiety, No sleep problems, No mood problems  Exam:  BP 117/78   Pulse 73   Temp 98.4 F (36.9 C) (Oral)   Ht 5\' 3"  (1.6 m)   Wt 106 lb (48.1 kg)   BMI 18.78 kg/m b  Constitutional: VS see above. General Appearance: alert, well-developed, well-nourished, NAD  Eyes: Normal lids and conjunctive, non-icteric sclera  Ears, Nose, Mouth, Throat: MMM, Normal external inspection ears/nares/mouth/lips/gums.    Neck: No masses, trachea midline.  Respiratory: Normal respiratory effort.   Gastrointestinal: Nontender, no masses. No hepatomegaly, no splenomegaly. No hernia appreciated. Bowel sounds normal. Rectal exam deferred.   Musculoskeletal: Gait normal. No clubbing/cyanosis of digits. Neg [place]oy[place] side.   Neurological: Normal balance/coordination. No tremor.   Skin: warm, dry, intact. No rash/ulcer.   Psychiatric: Normal judgment/insight. Normal mood and affect. Oriented x3.   I looked at urine sample in the lab - not really orange or substantially dark but a bit more concentrated appearing than normal    Results for orders placed or performed in visit on 07/07/17 (from the past 72 hour(s))  POCT Urinalysis Dipstick     Status: Abnormal   Collection Time: 07/07/17  1:59 PM  Result Value Ref Range   Color, UA yellow    Clarity, UA clear    Glucose, UA negative    Bilirubin, UA small    Ketones,  UA 15mg     Spec Grav, UA >=1.030 (A) 1.010 - 1.025   Blood, UA trace    pH, UA 5.5 5.0 - 8.0   Protein, UA trace    Urobilinogen, UA 0.2 0.2 or 1.0 E.U./dL   Nitrite, UA negative    Leukocytes, UA Negative Negative  Urine Microscopic     Status: Abnormal   Collection Time: 07/07/17  1:59 PM  Result Value Ref Range   WBC, UA 0-5 0 - 5 /HPF   RBC / HPF NONE SEEN 0 - 2 /HPF   Squamous Epithelial / LPF 10-20 (A) < OR = 5 /HPF   Bacteria, UA FEW (A) NONE SEEN /HPF   Hyaline Cast NONE SEEN NONE  SEEN /LPF    No results found.   ASSESSMENT/PLAN: Urine looks ok to me, absence of other symptoms of UTI or concerning back pain, likely viscerosomatic reflex of uterine cramps as IUD settles, be sure to keep follow-up w/ GYN, no vaginal bleeding or lower abdominal pain to suggest uterine perforation  Back pain, unspecified back location, unspecified back pain laterality, unspecified chronicity - Plan: POCT Urinalysis Dipstick  Urine abnormality - Plan: Urine Culture, Urine Microscopic   ADDENDUM 07/08/17 8:48 AM  Called patient - I had a thought about her case and wanted to follow up. I was unable to reach her so I left a voicemail to return clinic call for follow-up question.   I doubt that her pain would be due to a uterine perforation of the IUD, but I should have thought to do a pelvic exam yesterday to check the strings. If she calls back, we can double book her on my schedule today or have her see Vinetta BergamoCharley later in the afternoon as I am out of the office on Friday afternoons. Advised her to call clinic back at our main number.     Visit summary with medication list and pertinent instructions was printed for patient to review. All questions at time of visit were answered - patient instructed to contact office with any additional concerns. ER/RTC precautions were reviewed with the patient. Follow-up plan: Return if symptoms worsen or fail to improve.  Note: Total time spent 25 minutes, greater than 50% of the visit was spent face-to-face counseling and coordinating care for the following: The encounter diagnosis was Back pain, unspecified back location, unspecified back pain laterality, unspecified chronicity..  Please note: voice recognition software was used to produce this document, and typos may escape review. Please contact Dr. Lyn HollingsheadAlexander for any needed clarifications.

## 2017-07-08 ENCOUNTER — Telehealth: Payer: Self-pay | Admitting: Osteopathic Medicine

## 2017-07-08 LAB — URINE CULTURE
MICRO NUMBER: 81290703
Result:: NO GROWTH
SPECIMEN QUALITY: ADEQUATE

## 2017-07-08 LAB — URINALYSIS, MICROSCOPIC ONLY
Hyaline Cast: NONE SEEN /LPF
RBC / HPF: NONE SEEN /HPF (ref 0–2)

## 2017-07-08 NOTE — Telephone Encounter (Signed)
FYI to triage if this patient calls back: She was seen by me yesterday  Called patient - I had a thought about her symptoms and wanted to follow up. I was unable to reach her so I left a voicemail to return clinic call for follow-up question.   I doubt that her pain would be due to a uterine perforation of the IUD, but I should have thought to do a pelvic exam yesterday to check the strings. If she calls back, we can double book her on my schedule today or have her see someone later in the afternoon as I am out of the office on Friday afternoons - would be just a quick pelvic exam to check IUD strings, I think it would be reasonable to double book if needed but check with provider in PM if needed. Advised her to call clinic back at our main number.

## 2017-07-27 DIAGNOSIS — Z30431 Encounter for routine checking of intrauterine contraceptive device: Secondary | ICD-10-CM | POA: Diagnosis not present

## 2018-01-23 ENCOUNTER — Ambulatory Visit (INDEPENDENT_AMBULATORY_CARE_PROVIDER_SITE_OTHER): Payer: BLUE CROSS/BLUE SHIELD | Admitting: Family Medicine

## 2018-01-23 ENCOUNTER — Encounter: Payer: Self-pay | Admitting: Family Medicine

## 2018-01-23 VITALS — BP 112/75 | HR 84 | Ht 63.0 in | Wt 112.0 lb

## 2018-01-23 DIAGNOSIS — F430 Acute stress reaction: Secondary | ICD-10-CM

## 2018-01-23 DIAGNOSIS — E010 Iodine-deficiency related diffuse (endemic) goiter: Secondary | ICD-10-CM

## 2018-01-23 DIAGNOSIS — G43909 Migraine, unspecified, not intractable, without status migrainosus: Secondary | ICD-10-CM | POA: Diagnosis not present

## 2018-01-23 MED ORDER — RIZATRIPTAN BENZOATE 10 MG PO TBDP: 10.0000 mg | ORAL_TABLET | ORAL | 11 refills | Status: DC | PRN

## 2018-01-23 MED ORDER — TOPIRAMATE 25 MG PO TABS: 25.0000 mg | ORAL_TABLET | Freq: Every day | ORAL | 1 refills | Status: DC

## 2018-01-23 NOTE — Patient Instructions (Signed)
You can try a smart phone app to help you with medication reminders.

## 2018-01-23 NOTE — Progress Notes (Signed)
Subjective:    Patient ID: Candice Callahan, female    DOB: [birth year], 27 y.o.   MRN: [id number]  HPI  She is here today because she is had some personal issues going on.  She says she recently ended her friendship with her best friend and feels like partly it was her fault.  She would like to see and work with a therapist here in [place] who is female preferably.  He recently tried to restart fluoxetine and had her OB/GYN send it into the pharmacy but admits she was not taking it consistently so ended up to stopping it.  She had taken in the past and had done well with it.  Migraine headaches-she is been having headaches about 10 out of 30 days/month.  Sometimes she gets extremely nauseated and actually vomits.  She has used Maxalt in the past and that works well except for when she wakes up with a migraine headache.  Those she usually is not able to get control of and sometimes will miss work.  She says usually the best treatment is to just go home and sleep off the headache if she is able to.  Is really never been on prophylaxis.   Review of Systems  BP 112/75   Pulse 84   Ht 5\' 3"  (1.6 m)   Wt 112 lb (50.8 kg)   SpO2 100%   BMI 19.84 kg/m     No Known Allergies  No past medical history on file.  No past surgical history on file.  Social History   Socioeconomic History  . Marital status: Single    Spouse name: Not on file  . Number of children: Not on file  . Years of education: Not on file  . Highest education level: Not on file  Occupational History  . Not on file  Social Needs  . Financial resource strain: Not on file  . Food insecurity:    Worry: Not on file    Inability: Not on file  . Transportation needs:    Medical: Not on file    Non-medical: Not on file  Tobacco Use  . Smoking status: Former Games developer  . Smokeless tobacco: Never Used  Substance and Sexual Activity  . Alcohol use: Yes  . Drug use: Not on file  . Sexual activity: Not on file  Lifestyle   . Physical activity:    Days per week: Not on file    Minutes per session: Not on file  . Stress: Not on file  Relationships  . Social connections:    Talks on phone: Not on file    Gets together: Not on file    Attends religious service: Not on file    Active member of club or organization: Not on file    Attends meetings of clubs or organizations: Not on file    Relationship status: Not on file  . Intimate partner violence:    Fear of current or ex partner: Not on file    Emotionally abused: Not on file    Physically abused: Not on file    Forced sexual activity: Not on file  Other Topics Concern  . Not on file  Social History Narrative  . Not on file    No family history on file.  Outpatient Encounter Medications as of 01/23/2018  Medication Sig  . Levonorgestrel (KYLEENA) 19.5 MG IUD by Intrauterine route.  . rizatriptan (MAXALT-MLT) 10 MG disintegrating tablet Take 1 tablet (10 mg total) by mouth  as needed for migraine. May repeat in 2 hours if needed  . topiramate (TOPAMAX) 25 MG tablet Take 1 tablet (25 mg total) by mouth at bedtime. X 1 week, then increase to BID.  . [DISCONTINUED] rizatriptan (MAXALT-MLT) 10 MG disintegrating tablet Take 1 tablet (10 mg total) by mouth as needed for migraine. May repeat in 2 hours if needed (Patient not taking: Reported on 01/23/2018)   No facility-administered encounter medications on file as of 01/23/2018.          Objective:   Physical Exam  Constitutional: She is oriented to person, place, and time. She appears well-developed and well-nourished.  HENT:  Head: Normocephalic and atraumatic.  Mild thyromegaly on exam.  Cardiovascular: Normal rate, regular rhythm and normal heart sounds.  Pulmonary/Chest: Effort normal and breath sounds normal.  Neurological: She is alert and oriented to person, place, and time.  Skin: Skin is warm and dry.  Psychiatric: She has a normal mood and affect. Her behavior is normal.        Assessment & Plan:  Acute distress-we will refer to therapist/sling.  If she would like to continue the fluoxetine  she might want to consider downloading a smart phone app that helps her remember to take her medications.  Migraine headaches-discussed options.  She is experiencing quite frequent headaches to the point that I really think she should consider prophylaxis.  We discussed different options.  Would like to start with Topamax.  Warned about decreased appetite with the medication and numbness and tingling in the fingertips.  We will also refill her Maxalt.  She can also try taking it with Aleve if needed for improved effect.  Mild thyromegaly-we will check TSH as well as thyroid ultrasound.

## 2018-01-24 LAB — T4, FREE: Free T4: 1.1 ng/dL (ref 0.8–1.8)

## 2018-01-24 LAB — THYROID PEROXIDASE ANTIBODY: Thyroperoxidase Ab SerPl-aCnc: <1 IU/mL (ref <9)

## 2018-01-24 LAB — TSH: TSH: 0.64 m[IU]/L

## 2018-03-20 ENCOUNTER — Telehealth: Payer: Self-pay

## 2018-03-20 NOTE — Telephone Encounter (Signed)
Pt called to check and see if her referral was done for counseling. I verified with referral coordinator that this was done and called pt back and left her a VM advising referral was sent and gave her the phone number to contact them.

## 2018-03-27 ENCOUNTER — Encounter: Payer: Self-pay | Admitting: Family Medicine

## 2018-03-27 ENCOUNTER — Ambulatory Visit (INDEPENDENT_AMBULATORY_CARE_PROVIDER_SITE_OTHER): Payer: BLUE CROSS/BLUE SHIELD | Admitting: Family Medicine

## 2018-03-27 ENCOUNTER — Ambulatory Visit (INDEPENDENT_AMBULATORY_CARE_PROVIDER_SITE_OTHER): Payer: BLUE CROSS/BLUE SHIELD

## 2018-03-27 VITALS — BP 111/64 | HR 67 | Ht 63.0 in | Wt 118.0 lb

## 2018-03-27 DIAGNOSIS — F430 Acute stress reaction: Secondary | ICD-10-CM

## 2018-03-27 DIAGNOSIS — E010 Iodine-deficiency related diffuse (endemic) goiter: Secondary | ICD-10-CM

## 2018-03-27 DIAGNOSIS — G43909 Migraine, unspecified, not intractable, without status migrainosus: Secondary | ICD-10-CM

## 2018-03-27 MED ORDER — TOPIRAMATE 25 MG PO TABS: 25.0000 mg | ORAL_TABLET | Freq: Two times a day (BID) | ORAL | 1 refills | Status: DC

## 2018-03-27 NOTE — Progress Notes (Signed)
   Subjective:    Patient ID: Candice Callahan, female    DOB: [birth year], 27 y.o.   MRN: [id number]  HPI 27 year old female comes in today to follow-up for acute stress.  She has been taking the fluoxetine as prescribed by her OB/GYN though has not refilled it recently because she had a hard time getting to the pharmacy during open hours.  But she plans to stay on it and she did download a smart phone app to help her remember to take her medication regularly.  She has not had any significant side effects.  We did refer her for counseling but unfortunately [facility] did not take her insurance and so she has not been seen by anybody yet.  Follow-up migraine headaches-she is been taking the topiramate and says it has really made a big difference in the frequency and intensity of headaches.  She is also been using the Maxalt for rescue and that has worked effectively all but once.  She woke up with a headache one morning and took her Maxalt but it did not seem to relieve her headache she ended up going to work and ended up sending her home but otherwise she is doing well.   Review of Systems     Objective:   Physical Exam  Constitutional: She is oriented to person, place, and time. She appears well-developed and well-nourished.  HENT:  Head: Normocephalic and atraumatic.  Cardiovascular: Normal rate, regular rhythm and normal heart sounds.  Pulmonary/Chest: Effort normal and breath sounds normal.  Neurological: She is alert and oriented to person, place, and time.  Skin: Skin is warm and dry.  Psychiatric: She has a normal mood and affect. Her behavior is normal.        Assessment & Plan:  Acute stress -did encourage her to stick with the fluoxetine for now.  She thinks she still has refills on it but if not I will be happy to send over new prescription she can let me know.  We will try to get her scheduled for counseling downstairs at our behavioral health location she just needs  therapy and does not need medication management.   Migaine HAs -  Continue with topiramate and as needed Maxalt.  We can always adjust her Pap topiramate up to pending on the frequency of her migraines.  She will continue the current regimen for now and I will see her back in January.  She can only see me in my chart note if she wants to increase the topiramate dose.

## 2018-03-29 ENCOUNTER — Telehealth: Payer: Self-pay | Admitting: Family Medicine

## 2018-03-29 DIAGNOSIS — E010 Iodine-deficiency related diffuse (endemic) goiter: Secondary | ICD-10-CM

## 2018-03-29 NOTE — Telephone Encounter (Signed)
Labs ordered per US result note. Pt advised.

## 2018-04-10 ENCOUNTER — Ambulatory Visit (HOSPITAL_COMMUNITY): Payer: BLUE CROSS/BLUE SHIELD | Admitting: Licensed Clinical Social Worker

## 2018-04-10 ENCOUNTER — Ambulatory Visit (INDEPENDENT_AMBULATORY_CARE_PROVIDER_SITE_OTHER): Payer: BLUE CROSS/BLUE SHIELD | Admitting: Licensed Clinical Social Worker

## 2018-04-10 DIAGNOSIS — F321 Major depressive disorder, single episode, moderate: Secondary | ICD-10-CM | POA: Diagnosis not present

## 2018-04-10 DIAGNOSIS — F411 Generalized anxiety disorder: Secondary | ICD-10-CM

## 2018-04-10 DIAGNOSIS — F430 Acute stress reaction: Secondary | ICD-10-CM

## 2018-04-10 NOTE — Progress Notes (Signed)
Comprehensive Clinical Assessment (CCA) Note  04/10/2018 Doreene Nest [id number]  Visit Diagnosis:      ICD-10-CM   1. Current moderate episode of major depressive disorder, unspecified whether recurrent (HCC) F32.1   2. 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  (See scanned document in Chart Review)  CCA Part Two A  Intake/Chief Complaint:  CCA Intake With Chief Complaint CCA Part Two Date: 04/10/18 CCA Part Two Time: 1000 Chief Complaint/Presenting Problem: doesn't know how to talk to people, tried dating her best friend and didn't work out instead of breaking up with him, she treating on him and now in relationship with girl at work, worked through it, should have been more open with him, didn't want to hurt him, hurting him anyway, didn't know how to bring it up to him, knows that he really loves her, they were dating two months, she started working at the clinic, him and patient dated for a total of four months, her and patient started dating May 15, broke up with ex, a few days before that, two months into relationship, she and patient started talking, two months when she had started working, patient started dating him Dec 14 for four months, April this all happened, started working middle of,March, started dating her May 13, broke up with him a few days early, still not open with him about everything, he saw girl come to her house that brought up issues Patients Currently Reported Symptoms/Problems: should have started talking to someone awhile back, stuff in general, never been open with feelings, always been quite, stressed, sad a lot, on Prozac and been on a few years ago, helping, recently got back on it, downloaded an app to remind her so better about talking it Collateral Involvement: supports-mom, doesn't talk to her, doesn't want her to worry, they worry about each other, best friend who cheated  on-Colby, lives with mom Individual's Strengths: strengths-kind hearted, tries to be super friendly and helpful as possible, doesn't like to let people down Individual's Preferences: managing emotions, be able to understand feelings more, understand why she feels the way she does and express her emotions Individual's Abilities: takes care of dogs, has a full schedule so when comes home watches TV Type of Services Patient Feels Are Needed: therapy, med management Initial Clinical Notes/Concerns: Psychiatric history-talked to somebody at church she used to go to, Dr. Jarrett Soho in past, Dr. Janae Sauce recently prescribed Prozac-family history-patgf-alcoholic Medical issues-none  Mental Health Symptoms Depression:  Depression: Tearfulness, Change in energy/activity, Fatigue, Hopelessness, Sleep (too much or little), Worthlessness(everyday sad, sometimes fine and sometimes really sad rates 7 out of 10 with ten being the worst at her worst)  Mania:  Mania: N/A  Anxiety:   Anxiety: Worrying, Sleep, Fatigue(feel like gets her stuff back, feels going back and forth, clinic is stress in general, pulled from one thing to another, doesn't like her job, there for over a year, part of her wants to leave, short staffed, also stress of finding a new job, Museum/gallery conservator)  Psychosis:  Psychosis: N/A  Trauma:  Trauma: N/A  Obsessions:  Obsessions: N/A  Compulsions:  Compulsions: N/A  Inattention:  Inattention: N/A(went when younger and a few years ago tested and said didn't have it)  Hyperactivity/Impulsivity:  Hyperactivity/Impulsivity: N/A  Oppositional/Defiant Behaviors:  Oppositional/Defiant Behaviors: N/A  Borderline Personality:  Emotional Irregularity: N/A  Other Mood/Personality  Symptoms:  Other Mood/Personality Symptoms: dep-zones out but always been like that, Anxiety-job, stress over personal relationships, better in relationship but still upset with herself, 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 Birdena CrandallLauk to get filled again, started last year, anxiety-whenever certain events happen that she will would feel anxiety at one time such as being at the clinic, last panic attack, hasn't had one in a long time, can't remember what triggered it, 5-6  months ago-couldn't stop crying, hyperventilating, curled up in a fetal position, 5-10 minutes,   Mental Status Exam Appearance and self-care  Stature:  Stature: Small  Weight:  Weight: Average weight  Clothing:  Clothing: Casual  Grooming:  Grooming: Normal  Cosmetic use:  Cosmetic Use: None  Posture/gait:  Posture/Gait: Normal  Motor activity:  Motor Activity: Not Remarkable  Sensorium  Attention:  Attention: Normal  Concentration:  Concentration: Normal  Orientation:  Orientation: X5  Recall/memory:  Recall/Memory: Normal  Affect and Mood  Affect:  Affect: Appropriate, Tearful, Depressed  Mood:  Mood: Anxious, Depressed  Relating  Eye contact:  Eye Contact: Normal  Facial expression:  Facial Expression: Responsive  Attitude toward examiner:  Attitude Toward Examiner: Cooperative  Thought and Language  Speech flow: Speech Flow: Normal  Thought content:  Thought Content: Appropriate to mood and circumstances  Preoccupation:     Hallucinations:     Organization:     Company secretaryxecutive Functions  Fund of Knowledge:  Fund of Knowledge: Average  Intelligence:  Intelligence: Average  Abstraction:  Abstraction: Normal  Judgement:  Judgement: Fair  Dance movement psychotherapisteality Testing:  Reality Testing: Realistic  Insight:  Insight: Fair  Decision Making:  Decision Making: Paralyzed  Social Functioning  Social Maturity:  Social  Maturity: Responsible  Social Judgement:  Social Judgement: Normal  Stress  Stressors:  Stressors: Work, Illness  Coping Ability:  Coping Ability: Horticulturist, commercialxhausted  Skill Deficits:     Supports:      Family and Psychosocial History: Family history Marital status: (relationship with Herbalistrin for four months) Are you sexually active?: Yes What is your sexual orientation?: don't see herself dating a guy again Has your sexual activity been affected by drugs, alcohol, medication, or emotional stress?: n/a Does patient have children?: No  Childhood History:  Childhood History By whom was/is the patient raised?: Mother Additional childhood history information: parents divorced when patient 8, switch back and forth, mom raised her, dad was financial support, better with mom than dad Description of patient's relationship with caregiver when they were a child: mom was great, she worked, dad would work but not interact with them when came home, felt like he interacted more with brother but brother had sports so had to take him to the games, Patient's description of current relationship with people who raised him/her: dad wants her to come around, but now so busy [place] own life[place] hard for her to get out there, makes comments when she does see him, like hopefully I will see you more often, mom-good, mom upstairs and patient has own place downstairs How were you disciplined when you got in trouble as a child/adolescent?: n/a  Does patient have siblings?: Yes Number of Siblings: 4 Description of patient's current relationship with siblings: 1 biological brother(Conrad) and 3 step-sisters patient is younger than brother, one younger sister(Julie) but other two are older Alcario Drought is the [place] 30's and Jessica-in her 20's lives in [place] doesn't talk to her, talks to [employer] the most out of all of them(she lives with dad), she has a Development worker, international aid, niece that is three Did patient suffer any  verbal/emotional/physical/sexual abuse as a child?: No Did patient suffer from severe childhood neglect?: No Was the patient ever a victim of a crime or a disaster?: No Witnessed domestic violence?: No Has patient been effected by domestic violence as an adult?: No  CCA Part Two B  Employment/Work Situation: Employment / Work Situation Employment situation: Employed Where is patient currently employed?: [employer] How long has patient been employed?: a little over a year/[employer]-7 years(basically family, work there to work with people, doesn't like the job itself) Patient's job has been impacted by current illness: Yes Describe how patient's job has been impacted: sometimes, tried to quit before, trying to get a job at [employer] but couldn't get the shift she wanted, told employer that thought job impacting her mental health, doing the weekends, the lady went out with her knee so working on Thursday with second job doesn't have a day off  What is the longest time patient has a held a job?: [employer]-7 years Where was the patient employed at that time?: see above Are There Guns or Other Weapons in Your Home?: No  Education: Education School Currently Attending: n/a Last Grade Completed: 12 Name of High School: [employer] Did [place] Graduate From [employer]?: Yes Did Theme park manager?: (tried and didn't work out) Did Secretary/administrator School?: No Did You Have Any Special Interests In School?: school not her strong point Did You Have An Individualized Education Program (IIEP): No Did You Have Any Difficulty At School?: Yes(never diagnosed but always felt hard time focusing and remembering things) Were Any Medications Ever Prescribed For These Difficulties?: No  Religion: Religion/Spirituality Are You A Religious Person?: Yes(believes in God but not super religious) How Might This Affect Treatment?: n/a  Leisure/Recreation: Leisure /  Recreation Leisure and Hobbies: see above  Exercise/Diet: Exercise/Diet Do You Exercise?: No Have You Gained or Lost A Significant Amount of Weight in the Past Six Months?: No Do You Follow a Special Diet?: No Do You Have Any Trouble Sleeping?: Yes Explanation of Sleeping Difficulties: wishes she could sleep in bed all day, gets up for responsibities  CCA Part Two C  Alcohol/Drug Use: Alcohol / Drug Use Pain Medications: for scolosis takes something every day, and one when it hurts Prescriptions: see med list Over the Counter: see med list History of alcohol / drug use?: Yes Substance #1 Name of Substance 1: marijuana 1 - Age of First Use: junior year high school 1 - Amount (size/oz): dabs-2 dabs a day-,3 mg, THC 1 - Frequency: every day 1 - Duration: since high school 1 - Last Use / Amount: 04/09/18-dab                    CCA Part Three  ASAM's:  Six Dimensions of Multidimensional Assessment  Dimension 1:  Acute Intoxication and/or Withdrawal Potential:     Dimension 2:  Biomedical Conditions and Complications:     Dimension 3:  Emotional, Behavioral, or Cognitive Conditions and Complications:     Dimension 4:  Readiness to Change:     Dimension 5:  Relapse, Continued use, or Continued Problem Potential:     Dimension 6:  Recovery/Living Environment:      Substance use Disorder (SUD) Substance Use Disorder (SUD)  Checklist Symptoms of Substance Use: Evidence of tolerance  Social Function:  Social Functioning Social Maturity: Responsible Social Judgement: Normal  Stress:  Stress Stressors: Work, Illness Coping Ability: Exhausted Patient Takes Medications The Way The Doctor Instructed?: Yes Priority Risk: Low Acuity  Risk Assessment- Self-Harm Potential: Risk Assessment For Self-Harm Potential Thoughts of Self-Harm: No current thoughts Method: No plan Availability of Means: No access/NA  Risk Assessment -Dangerous to Others Potential: Risk Assessment  For Dangerous to Others Potential Method: No Plan Availability of Means: No access or NA Intent: Vague intent or NA Notification Required: No need or identified person  DSM5 Diagnoses: Patient Active Problem List   Diagnosis Date Noted  . Acute depression 12/10/2013  . THYROMEGALY 12/09/2008  . NECK PAIN 12/09/2008  . Migraine headache 12/09/2008    Patient Centered Plan: Patient is on the following Treatment Plan(s):  Anxiety and Depression, work on self-esteem, stress management-treatment plan formulated at next treatment session.  Recommendations for Services/Supports/Treatments: Recommendations for Services/Supports/Treatments Recommendations For Services/Supports/Treatments: Individual Therapy, Medication Management  Treatment Plan Summary: patient is a 27 year old female referred by her primary care doctor for acute stress disorder.patient relates anxiety related to situational stressors including stress over personal relationships, work stressors, describes having stress in every part of her life,feels the need to work on emotional regulation skills, including understanding why she feels the way she does, being able to express her emotions. Patient describes current depressive symptoms, including hopelessness at times, denies SI or past SA, SIB,. She has been on Prozac in the past and has been helpful and recently prescribed Prozac by Dr. Birdena CrandallLauk, her gynecologist, and it has been helpful. She is currently smoking marijuana daily, has developed tolerance because does not identify any problems with usage. She denies past abuse and HI.She is recommended for individual therapy to help her work on emotional regulation skills, coping, stress management, strength based and supportive intervention as well as continuing with med management.    Discussed with patient significant aspect that workplace and her stress and importance of putting her needs as a priority that also will help with her  functioning. PHQ-9=10 moderate depression GAD-7=9-moderate anxiety ACE=1  Referrals to Alternative Service(s): Referred to Alternative Service(s):   Place:   Date:   Time:    Referred to Alternative Service(s):   Place:   Date:   Time:    Referred to Alternative Service(s):   Place:   Date:   Time:    Referred to Alternative Service(s):   Place:   Date:   Time:     Coolidge BreezeMary Lacara Dunsworth

## 2018-05-02 ENCOUNTER — Ambulatory Visit (INDEPENDENT_AMBULATORY_CARE_PROVIDER_SITE_OTHER): Payer: BLUE CROSS/BLUE SHIELD | Admitting: Licensed Clinical Social Worker

## 2018-05-02 DIAGNOSIS — F430 Acute stress reaction: Secondary | ICD-10-CM

## 2018-05-02 DIAGNOSIS — F411 Generalized anxiety disorder: Secondary | ICD-10-CM

## 2018-05-02 DIAGNOSIS — F321 Major depressive disorder, single episode, moderate: Secondary | ICD-10-CM

## 2018-05-02 NOTE — Progress Notes (Signed)
   THERAPIST PROGRESS NOTE  Session Time: 11:15 PM to 12:08 PM  Participation Level: Active  Behavioral Response: CasualAlertEuthymic  Type of Therapy: Individual Therapy  Treatment Goals addressed: major depressive disorder, moderate, unspecified wether recurrent, anxiety and acute stress reaction  Interventions: Solution Focused, Strength-based, Supportive and Other: stress managememt, motivational interviewing  Summary: Candice Callahan is a 27 y.o. female who presents with major depressive disorder, moderate, unspecified wether recurrent, acute stress reaction  Suicidal/Homicidal: No  Therapist Response: Patient checked in and shares that she "hates" her job, stresses her out, impacting her mental health. On Monday shared this with her mom, got to the point of crying because of stressful environment. Patient shares examples of situations at work that create such a stressful environment including too much work, interpersonal stressors with doctors. Reviewed pros and cons of staying or leaving work, patient relates she definitely wants to leave, doesn't want to stay in the field, has a job lined up, she will implement new work schedule that will give her a couple days off that she needs, right now she works every day and once in a while has a day off. Discussed current stressors in her relationship with best friend that she has to balance between being with her girlfriend and friend who does not like her girlfriend, patient carries guilt about what she did to friend when she cheated on him, dated him for 4 months, cheated on him for 2 months but have been best friends for 4 years. Patient shares feels best friend sometimes brings things on himself purposely creates negative situations for himself and other people, discussed needing to make decisions as an adult that are good for [place] for self-care. atient shared has attempted to set boundaries but though he verbally accepts them doesn't act as if he  wants to take a back seatwhile she is an relationship. herapist encouraged patient to keep reinforcing boundaries.  Therapist assess patient's current functioning per report. Process with patient feelings related to current stressors of job situation, utilized motivational interviewing strategies to review pros and cons to help patient in making a decision so she can take action to make changes in her situation. Discussed making sure to make herself priority, that other people won't consider her as a top priority, this is something she has to do for herself to make sure her needs are being met.discussed setting up a schedule where there is more balance in her life have time for herself that gives her an opportunity to enjoy her life and help with mood. Discussed stressor in her relationship with best friend, process her feelings related to this relationship, encouraged setting firm boundaries to take care of herself, identified guilt as an issue in this relationship that needs to be worked through. Provided strength based and supportive intervention  Plan: Return again in 2 weeks.2.her past work with patient on stress management, effective interpersonal relationships  Diagnosis: Axis I: Current moderate episode of major depressive disorder, unspecified whether recurrent, Anxiety in acute stress reaction    Axis II: No diagnosis    Cordella Register, LCSW 05/02/2018

## 2018-05-16 ENCOUNTER — Ambulatory Visit (INDEPENDENT_AMBULATORY_CARE_PROVIDER_SITE_OTHER): Payer: BLUE CROSS/BLUE SHIELD | Admitting: Licensed Clinical Social Worker

## 2018-05-16 DIAGNOSIS — F430 Acute stress reaction: Secondary | ICD-10-CM

## 2018-05-16 DIAGNOSIS — F411 Generalized anxiety disorder: Secondary | ICD-10-CM | POA: Diagnosis not present

## 2018-05-16 DIAGNOSIS — F321 Major depressive disorder, single episode, moderate: Secondary | ICD-10-CM

## 2018-05-16 NOTE — Progress Notes (Signed)
   THERAPIST PROGRESS NOTE  Session Time: 9:10 AM to 10:00 AM  Participation Level: Active  Behavioral Response: CasualAlertEuthymic  Type of Therapy: Individual Therapy  Treatment Goals addressed: work on effective communication of emotions, effective interpersonal skills, coping  Interventions: Solution Focused, Strength-based, Supportive and Other: emotional regulation, effective interpersonal strategies  Summary: Candice Callahan is a 27 y.o. female who presents with major depressive disorder, moderate, unspecified wether recurrent, acute stress reaction.   Suicidal/Homicidal: No  Therapist Response: Shares that she put in notice at her job, feels good that she finally did it. Her new job schedule includes working at two different places but this time she will have a couple of days off. Reviewed goals and patient identifies herself as passive-aggressive, needs to work on better expressing how she feels. Reviewed incident with best friend and patient acknowledges that made it worse when she didn't tell him how she felt. Discussed how not expressing feelings causing [place] to hold on to negative emotions causing mood symptoms to worsen and also can have a negative impact on relationship, can hurt somebody else, also can be a manipulative strategy that is not appreciated by the other person. Shares not expressing how she feels can be frustrating but passes it off, therapist pointed importance of well being in getting one's needs met. Patient shares that she knows the better thing, to say something but has a hard time so doesn't. Therapist shared paying attention to our wise mind, our intuition to help guide [place]. Discussed developing new habits of speaking up and begin to practice in neutral situations. Reviewed medications and patient currently in Effexor prescribed by gynecologist.  Therapist assed patient current functioning per report. Provided positive feedback for patient being assertive of where  she works. Pointed out being assertive is how she gets her needs met. Completed treatment plan and identified being better able to express how she feels, working on changing passive aggressive approach to communication. Educated patient on emotions, that identifying, processing and releasing emotions is effective management and hold on to emotions has negative impact, leads to ongoing negative emotions and worsening of symptoms. Reviewed passive aggressive approach in situation with friend where she hurt him, how being passive made things worse, being direct would have been a better way to address situation. Discussed guilt feelings she has as a way for her to learn from mistakes and bad choices so it has a beneficial impact. Discussed wise mind which is all knowing, getting in touch with it and allowing oneself to be directed is helping in guiding oneself in healthy ways. Discussed building up habits of healthy communication, patient working expressing herself in situations that don't have as much at stake. Provided strength based and supportive interventions.  Plan: Return again in 2 weeks.2. Work with patient on effective interpersonal skills, provide more education on passive aggressive communication styles and wise mind.   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

## 2018-06-09 ENCOUNTER — Ambulatory Visit (INDEPENDENT_AMBULATORY_CARE_PROVIDER_SITE_OTHER): Payer: BLUE CROSS/BLUE SHIELD | Admitting: Licensed Clinical Social Worker

## 2018-06-09 DIAGNOSIS — F430 Acute stress reaction: Secondary | ICD-10-CM

## 2018-06-09 DIAGNOSIS — F411 Generalized anxiety disorder: Secondary | ICD-10-CM | POA: Diagnosis not present

## 2018-06-09 DIAGNOSIS — F321 Major depressive disorder, single episode, moderate: Secondary | ICD-10-CM

## 2018-06-09 NOTE — Progress Notes (Signed)
THERAPIST PROGRESS NOTE  Session Time: 9:05 AM to 9:57 AM  Participation Level: Active  Behavioral Response: CasualAlertEuthymic  Type of Therapy: Individual Therapy  Treatment Goals addressed:  work on effective communication of emotions, effective interpersonal skills, coping Interventions: DBT, Solution Focused, Strength-based, Supportive and Other: coping  Summary: Candice Callahan is a 27 y.o. female who presents with major depressive disorder, moderate, unspecified wether recurrent, acute stress reaction.    Suicidal/Homicidal: No  Therapist Response: Patient shares she has been working on communication, talked to friend about giving her more space in their relationship and feels that it went "ok", he told her that he doesn't like change, but that if it bothers her he would back off, feels good about his response because he is taking how she feels into consideration. Also, notices that he has made some changes in giving her more space. Patient shares another issues is that he wants to talk to her girlfriend alone and not sure but doesn't think this is a good idea. Also, getting that feedback from her mom that it isn't such a good idea. Girlfriend is uncomfortable with it. Explored his motivations for wanting to do this, encouraged patient to explore with her friend as well and question him on whether this would really be something beneficial. Explore with her friend so he can consider wants and needs of not only himself but patient and others. Shared started new job Wednesday and had fun. Therapist introduced education about communication styles that are effective and ineffective as well as introducing Interpersonal Effectiveness skills from DBT.(see below) Reviewed session and relates she feels education helps her with what she needs to do next to get where she wants to be. Pointed out that she liked [facility] skills. Therapist to assess patient current functioning per report.  Provided  positive feedback for patient actively working on her communication.  Discussed positive impact it had on patient as a motivational strategy for patient to be more assertive in her relationship.  Reviewed that with a passive communication style a person can sacrifice their own wants and needs and prioritize other peoples wants and needs.  Discussed short-term gains of protecting the relationship are sacrificed for long-term negative impact of abandoning her own needs, creates frustration that can build up inside, can lead to blowing up, depression, relationship could take a shape that no longer can stand.  Introduced Electronics engineer from DBT how skills can be learn that increase her effectiveness in relationships that first in dealing with other people, we need to clarify what we want from the interaction and identify what we need to do out in order to get the results we want.  Discussed different skills can be used depending on what is most important in our relationship [facility] skills help [place] to gain our objective, GIVE  skills to maintain the relationship and FAST skills to maintain self-respect.  Discussed mindful attention as is an important skill to cultivate as it helps you read important signals in the relationship.  Discussed patient attending to her values as an important interpersonal skill, defines the nature of her relationships, and encouraged patient to explore her values in her relationships.  Processed with patient as well current issue in her relationship explored how she feels helping her in her perspective of how she wants to deal with this issue in her relationship.  Provided strength based on supportive intervention.  Plan: Return again in 2 weeks.2.Therapist continue to provide education on DBT interpersonal effectiveness skills.3.Therapist continue to  work with patient on communication, coping  Diagnosis: Axis I:  Current moderate episode of major depressive disorder,  unspecified whether recurrent, Anxiety in acute stress reaction    Axis II: No diagnosis    Coolidge Breeze, LCSW 06/09/2018

## 2018-06-23 ENCOUNTER — Telehealth: Payer: Self-pay | Admitting: Family Medicine

## 2018-06-23 NOTE — Telephone Encounter (Signed)
Pt called out of work yesterday due to Migraine. Wants to see if PCP will write her a work note for that. Routing.

## 2018-06-23 NOTE — Telephone Encounter (Signed)
Note provided.  Did encourage her to get FMLA paperwork from her employer so that we can cover her for days that she may miss for her migraine headaches.

## 2018-06-26 ENCOUNTER — Ambulatory Visit (INDEPENDENT_AMBULATORY_CARE_PROVIDER_SITE_OTHER): Payer: BLUE CROSS/BLUE SHIELD | Admitting: Licensed Clinical Social Worker

## 2018-06-26 DIAGNOSIS — F411 Generalized anxiety disorder: Secondary | ICD-10-CM

## 2018-06-26 DIAGNOSIS — F321 Major depressive disorder, single episode, moderate: Secondary | ICD-10-CM | POA: Diagnosis not present

## 2018-06-26 DIAGNOSIS — F430 Acute stress reaction: Secondary | ICD-10-CM

## 2018-06-26 NOTE — Progress Notes (Signed)
THERAPIST PROGRESS NOTE  Session Time: 3:03 Pm to 3:58 PM  Participation Level: Active  Behavioral Response: CasualAlertEuthymic  Type of Therapy: Individual Therapy  Treatment Goals addressed: work on effective communication of emotions, effective interpersonal skills, coping  Interventions: Solution Focused, Strength-based, Supportive and Other: effective interpersonal skills, coping  Summary: Candice Callahan is a 27 y.o. female who presents with major depressive disorder, moderate, unspecified wether recurrent, acute stress.   Suicidal/Homicidal: No  Therapist Response: Patient shares that she really enjoys her job at [employer], lot less stressful than previous job, working two jobs but making sure she has time for herself. Shared that relationship with friend, she feels she has set boundaries but he is slowing bringing himself back into her life, there are legitimate reasons as he is moving, but does not want to be hurtful toward girlfriend.  Relates she is getting better about patient spending time with friend Candice Callahan, does get upset about amount of time they spend together.  Her girlfriend does not always tell her when things are bothering her and patient wants to know, better for relationship.Describes that the way things are right now is related to girlfriend's parents being overprotective, friend just does not have as much time to be together, whereas she and her friend Candice Callahan have more permissive parents and give them more latitude to do what they want. Patient describes she is adopted and lots of struggles in her relationship with parents. When she gets upset with patient she has to remind girlfriend that it is not all her fault.  Patient shares doing better about speaking up, and past experience has taught her to be more aware of her relationships and wanting to be aligned with her values.  Therapist reviewed DBT interpersonal skills and related thinking about making goals of  relationship and slowing things down to be able to be more thoughtful about it and then choosing behaviors and actions in line with goal.  Discussed negative impact of passive aggressive communication, discussed how patient can be a passive but sometimes it is not the right time and can be more harmful to share how one is feeling.  Situations that the man her not to always speak her mind.  Therapist to assess patient current functioning per report.  Provided psychoeducation about interpersonal relationships.It is helpful to look at your relationships its helpful to look at your relationships in terms of 3 main goals: Getting her needs met assertively, maintaining relationships and preserving her self-respect when it comes to choosing and using interpersonal skills the first step is to identify what your goal is in a particular situation, given the type of relationship involved in the outcome you hope to achieve.  At any given time and from one situation to the next, you will begin to prioritize her goals.  Then as you consider your next step the question you want to ask as what choices her actions would like likely get me closer to meeting my objectives and this situation.  Identified patient realizing that she wants to interact in situation where she maintains her values in past situation with her friend helps her to realize that.  Therapist also encourage patient to slow reaction time down to better reflect on goals.  Discussed specifically issues in her relationships and effective coping in her relationships.  Identified different reasons patient does not speak up, often it is not the right time.  Would be more harmful to speak up so not always just because passive aggressive communication  style.  Discussed negative impact of passive-aggressive style on relationship and patient has utilized this style, so to gain more awareness of its harmful impact.  Discussed patient speaking up more and make an effort in her  relationships.  Provided strength based and supportive intervention.  Plan: Return again in 2 weeks.2.  Purpose work with patient on effective interpersonal strategies, coping  Diagnosis: Axis I: Current moderate episode of major depressive disorder, unspecified whether recurrent, Anxiety in acute stress reaction    Axis II: No diagnosis    Cordella Register, LCSW 06/26/2018

## 2018-06-27 ENCOUNTER — Encounter: Payer: Self-pay | Admitting: Family Medicine

## 2018-06-27 ENCOUNTER — Ambulatory Visit (INDEPENDENT_AMBULATORY_CARE_PROVIDER_SITE_OTHER): Payer: BLUE CROSS/BLUE SHIELD | Admitting: Family Medicine

## 2018-06-27 VITALS — BP 122/67 | HR 55 | Ht 63.0 in | Wt 119.0 lb

## 2018-06-27 DIAGNOSIS — S29012A Strain of muscle and tendon of back wall of thorax, initial encounter: Secondary | ICD-10-CM | POA: Diagnosis not present

## 2018-06-27 NOTE — Patient Instructions (Addendum)
Thank you for coming in today.  I think you have a rhomboid strain.    Use a heating pad, TENS unit and ibuprofen or aleve.   Attend PT.   Exercises.  1) Bring hand back to side with elbows bending.  2) With elbow straight arms down the side.  3) With elbow straight arms out to the side.   Make sure to pinch your shoulder blades together during these exercises.    Recheck as needed.

## 2018-06-28 ENCOUNTER — Encounter: Payer: Self-pay | Admitting: Family Medicine

## 2018-06-28 NOTE — Progress Notes (Signed)
Candice Callahan is a 27 y.o. female who presents to [facility] today for left shoulder pain.  Kaly was walking her medium-sized dog about 3 weeks ago.  The dog jerked her arm.  She developed pain in her left trapezius area.  This is been mild to moderately bothersome the last 3 weeks worsening about 5 days ago without any new injury.  She did not fall to the ground.  She denies any radiating pain weakness or numbness.  Pain is worse with activity and better with rest.  She is tried heat and ibuprofen which helps a bit.  She feels well otherwise with no fevers or chills nausea vomiting or diarrhea.    ROS:  As above  Exam:  BP 122/67   Pulse (!) 55   Ht 5\' 3"  (1.6 m)   Wt 119 lb (54 kg)   BMI 21.08 kg/m  General: Well Developed, well nourished, and in no acute distress.  Neuro/Psych: Alert and oriented x3, extra-ocular muscles intact, able to move all 4 extremities, sensation grossly intact. Skin: Warm and dry, no rashes noted.  Respiratory: Not using accessory muscles, speaking in full sentences, trachea midline.  Cardiovascular: Pulses palpable, no extremity edema. Abdomen: Does not appear distended. MSK:  C-spine nontender to spinal midline normal neck motion. Tender palpation left trapezius. Upper extremity strength is intact.  Pulses capillary refill and sensation and reflexes are equal normal throughout.  Left shoulder: Normal-appearing.  Tender palpation left trapezius and rhomboid. Pain with abduction located at the rhomboid area however abduction range of motion is full.  External/internal rotation is also intact. Significant reduction in abduction pain with manual scapular stabilization. Negative Hawkins and Neer's test. Negative empty can test.  Contralateral right shoulder normal-appearing nontender normal motion.     Assessment and Plan: 27 y.o. female with  Left trapezius and rhomboid pain due to myofascial strain  and dysfunction.  Plan for referral to physical therapy home exercise program heating pad TENS unit.  Continue over-the-counter medications for pain as needed.  Recheck with me if not improving.    Orders Placed This Encounter  Procedures  . Ambulatory referral to Physical Therapy    Referral Priority:   Routine    Referral Type:   Physical Medicine    Referral Reason:   Specialty Services Required    Requested Specialty:   Physical Therapy   No orders of the defined types were placed in this encounter.   Historical information moved to improve visibility of documentation.  Past Medical History:  Diagnosis Date  . Acute depression 12/10/2013  . Migraine headache 12/09/2008   Qualifier: Diagnosis of  By: Linford Arnold MD, Santina Evans     History reviewed. No pertinent surgical history. Social History   Tobacco Use  . Smoking status: Former Games developer  . Smokeless tobacco: Never Used  Substance Use Topics  . Alcohol use: Yes   Family history no pertinent family history  Medications: Current Outpatient Medications  Medication Sig Dispense Refill  . FLUoxetine (PROZAC) 20 MG tablet Take 20 mg by mouth daily.    . Levonorgestrel (KYLEENA) 19.5 MG IUD by Intrauterine route.    . rizatriptan (MAXALT-MLT) 10 MG disintegrating tablet Take 1 tablet (10 mg total) by mouth as needed for migraine. May repeat in 2 hours if needed 9 tablet 11  . topiramate (TOPAMAX) 25 MG tablet Take 1 tablet (25 mg total) by mouth 2 (two) times daily. 180 tablet 1   No current facility-administered  medications for this visit.    No Known Allergies    Discussed warning signs or symptoms. Please see discharge instructions. Patient expresses understanding.

## 2018-07-13 ENCOUNTER — Ambulatory Visit (INDEPENDENT_AMBULATORY_CARE_PROVIDER_SITE_OTHER): Payer: BLUE CROSS/BLUE SHIELD | Admitting: Licensed Clinical Social Worker

## 2018-07-13 DIAGNOSIS — F411 Generalized anxiety disorder: Secondary | ICD-10-CM | POA: Diagnosis not present

## 2018-07-13 DIAGNOSIS — Z639 Problem related to primary support group, unspecified: Secondary | ICD-10-CM

## 2018-07-13 DIAGNOSIS — F321 Major depressive disorder, single episode, moderate: Secondary | ICD-10-CM

## 2018-07-13 DIAGNOSIS — F430 Acute stress reaction: Secondary | ICD-10-CM

## 2018-07-13 NOTE — Progress Notes (Signed)
THERAPIST PROGRESS NOTE  Session Time: 11:10 AM to 12:00 PM  Participation Level: Active  Behavioral Response: CasualAlertEuthymic  Type of Therapy: Individual Therapy  Treatment Goals addressed:  work on effective communication of emotions, effective interpersonal skills, coping  Interventions: Solution Focused, Strength-based, Supportive and Other: effective interpersonal strategies  Summary: Candice Callahan is a 27 y.o. female who presents with things worse because of tension between [place], girlfriend, and best friend, Candice Callahan.  And shares they argue through her, taking out anger on her and with her conflict they are forgetting about patient impact it has on patient as they are in conflict but there conflict is taken out on patient.  It was a point where she almost broke up with girlfriend.  Girlfriend admits to being selfish, jealous and wants to work on herself in the relationship.  Patient is pushing for all of them to talk together to work out conflicts.  Reviewed what patient needs including having space, shares she does not have a day to herself .  There is best friend is coming back to much in her life but admits she does not set good boundaries and needs to work on this.  Shares she does not have a night when she comes home from work and can be on her own.  Sees she is getting angry about her best friend coming over, there is always a reason he has to come over.  Discussed how emotions give his insight to her needs.  She relates how this happens is that she is indifferent about it before he comes over, but wants over gets annoyed and needs to stick to boundaries.  Recognizes both friends is clingy, plans to have a notebook so she can remembers how much she time she spends with each of them.  Discussed making romantic relationship priority but also needing friendships outside relationship for it to be healthy.  Patient would review conflict with girlfriend and discussed helpful strategies to  managing include applying emotional regulation skills(see below).  Reviewed session and patient relates she learned she has to stick to boundaries Therapist to assess patient current functioning per report and continue to work with patient on healthy interpersonal skills.  Identified patient's needs that are not being met including not having her own space, patient to address this by speaking up for herself getting an notebook to remember time she spends with friends.  Identified helpful friends are able to see their issues better to help with situation patient also recognizes her part and needs to work on it.  Discussed working on healthy relationship includes giving her partner space to have other friendships, if not can lead to having negative feelings around relationship by person who is limited in who they see that can leads to worsening of relationship.  Provided positive feedback for patient's motivation to speak up and addressing issues.  Reviewed conflict with girlfriend and reviewed helpful emotional regulation skills that are helpful for her but also with general to allow oneself to calm down, when one is more rational one has better approaches to things one has more choices a ways to react.  Also discussed self-awareness is key to regulating emotions, help therapy can help with that, taking a step back and getting perspective of oneself creates the awareness to identify unhealthy coping, things that are going well and things that need changed.  Discussed that meditations help in bringing awareness to once mineralizations.  Provided supportive and strength-based interventions  Suicidal/Homicidal: No  Plan: Return  again in 2 weeks.2.  Therapist work with patient on effective interpersonal strategies, coping  Diagnosis: Axis I:  Current moderate episode of major depressive disorder, unspecified whether recurrent, Anxiety in acute stress reaction    Axis II: No diagnosis    Mary Bowman,  LCSW 07/13/2018  

## 2018-08-04 ENCOUNTER — Ambulatory Visit (HOSPITAL_COMMUNITY): Payer: BLUE CROSS/BLUE SHIELD | Admitting: Licensed Clinical Social Worker

## 2018-08-28 ENCOUNTER — Ambulatory Visit: Payer: BLUE CROSS/BLUE SHIELD | Admitting: Family Medicine

## 2018-08-28 ENCOUNTER — Ambulatory Visit (HOSPITAL_COMMUNITY): Payer: BLUE CROSS/BLUE SHIELD | Admitting: Licensed Clinical Social Worker

## 2019-07-11 ENCOUNTER — Ambulatory Visit (INDEPENDENT_AMBULATORY_CARE_PROVIDER_SITE_OTHER): Payer: Medicaid Other | Admitting: Family Medicine

## 2019-07-11 ENCOUNTER — Other Ambulatory Visit: Payer: Self-pay

## 2019-07-11 ENCOUNTER — Encounter: Payer: Self-pay | Admitting: Family Medicine

## 2019-07-11 VITALS — Temp 98.9°F | Wt 113.0 lb

## 2019-07-11 DIAGNOSIS — Z20828 Contact with and (suspected) exposure to other viral communicable diseases: Secondary | ICD-10-CM | POA: Diagnosis not present

## 2019-07-11 DIAGNOSIS — Z20822 Contact with and (suspected) exposure to covid-19: Secondary | ICD-10-CM

## 2019-07-11 NOTE — Progress Notes (Signed)
Virtual Visit via Video Note  I connected with Candice Callahan on 07/11/19 at  4:30 PM EST by a video enabled telemedicine application and verified that I am speaking with the correct person using two identifiers.   I discussed the limitations of evaluation and management by telemedicine and the availability of in person appointments. The patient expressed understanding and agreed to proceed.  Subjective:    CC: not feeling well.   HPI: 28 yo c/o sxs that started yesterday . Felt really achy and tired. Has felt the same today.  She did check her temperature but it was normal.  But she is felt so cold she has been wearing a jacket all day.  Mild sore throat. Mild HA. No GI sxs.  Work at [employer].  No sxs symptoms. No cough.  He is also worried about possible exposure for her mother who also lives in the home though [place] lives in the basement and her mother mother lives on the upper floor.  Her mother has thus far been asymptomatic.  Candice Callahan says that at work she has been wearing a mask and taking precautions and so otherwise she is not sure where she would have gotten it she is not aware of any potential Covid exposures.  Nobody else at work or at home has been sick.   Past medical history, Surgical history, Family history not pertinant except as noted below, Social history, Allergies, and medications have been entered into the medical record, reviewed, and corrections made.   Review of Systems: No fevers, chills, night sweats, weight loss, chest pain, or shortness of breath.   Objective:    General: Speaking clearly in complete sentences without any shortness of breath.  Alert and oriented x3.  Normal judgment. No apparent acute distress.    Impression and Recommendations:    Viral symptoms/possible Covid-recommend that she get tested.  Consider flu testing if negative for Covid.  Recommend symptomatic care with Aleve or ibuprofen or Tylenol as needed and make sure you take staying  hydrated.  If at any point she develops any new symptoms like fevers or cough or diarrhea then please let us know.  In regards to her mother recommend that her mother weight.  If Candice Callahan is positive then her mom can go get tested this weekend which would be approximately 5 days after potential exposure.  Mom needs to be aware of potential symptoms and keep an eye out.  Recommend the patient continue self quarantine.  Gave her resources to go and get tested.     I discussed the assessment and treatment plan with the patient. The patient was provided an opportunity to ask questions and all were answered. The patient agreed with the plan and demonstrated an understanding of the instructions.   The patient was advised to call back or seek an in-person evaluation if the symptoms worsen or if the condition fails to improve as anticipated.   [name], MD

## 2019-07-12 ENCOUNTER — Ambulatory Visit: Payer: PRIVATE HEALTH INSURANCE | Admitting: Family Medicine

## 2019-10-14 IMAGING — US US THYROID
1 series · 13 of 25 positions shown · non-contrast
Comparison: None.

CLINICAL DATA: Palpable abnormality.  Thyromegaly.

EXAM:
THYROID ULTRASOUND
TECHNIQUE: Ultrasound examination of the thyroid gland and adjacent soft
tissues was performed.

[Series 1: us thyroid · 0.06mm/px · 13 of 46 slices shown]
[im 1/46]
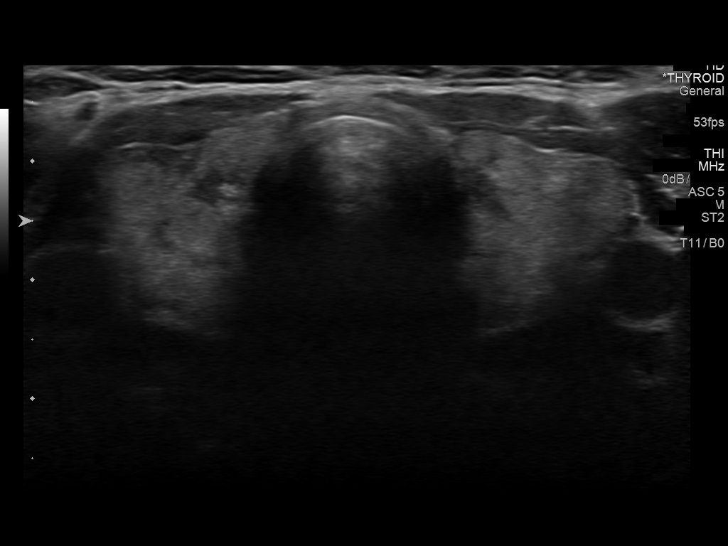
[im 4/46]
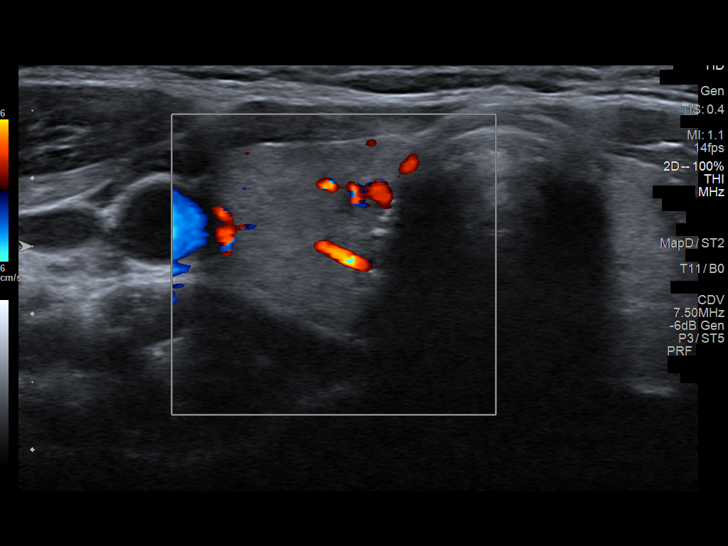
[im 8/46]
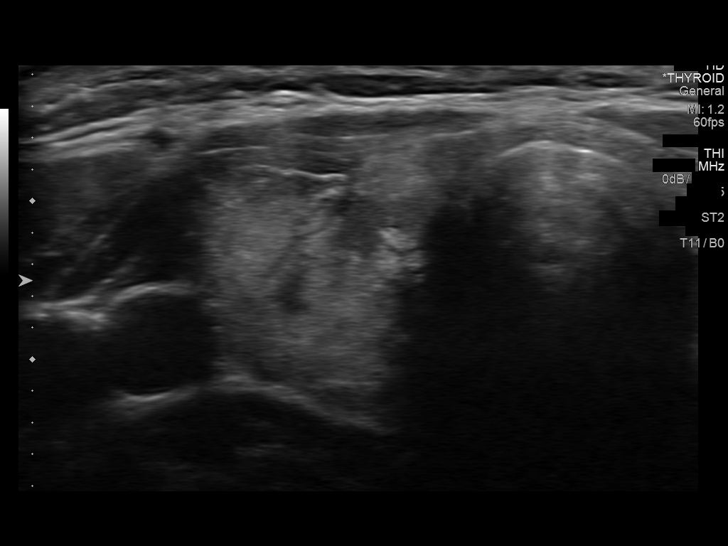
[im 12/46]
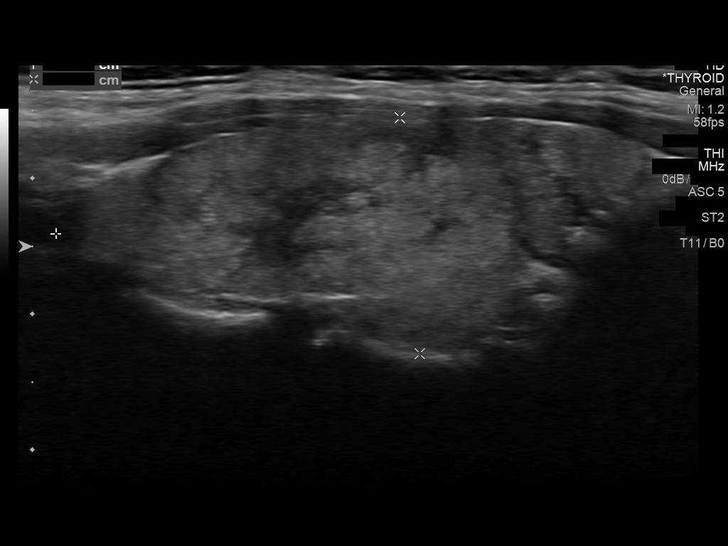
[im 16/46]
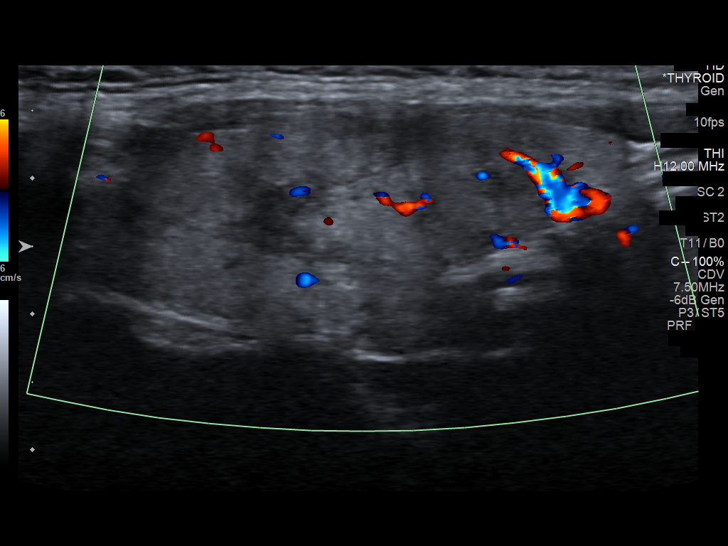
[im 19/46]
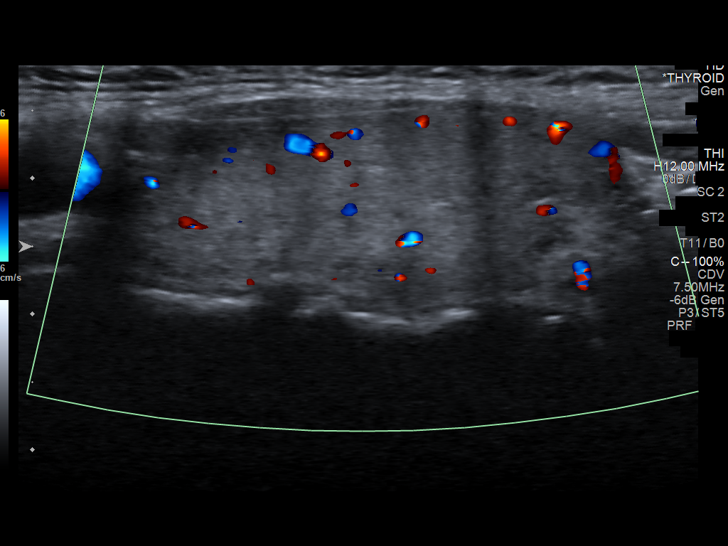
[im 23/46]
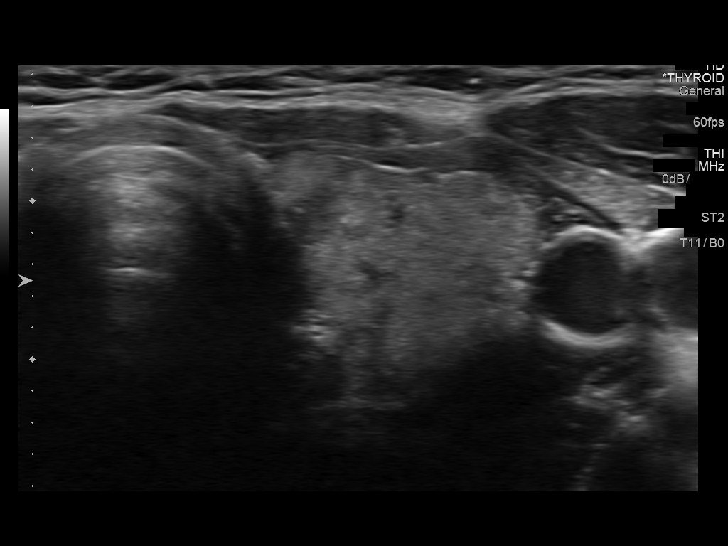
[im 27/46]
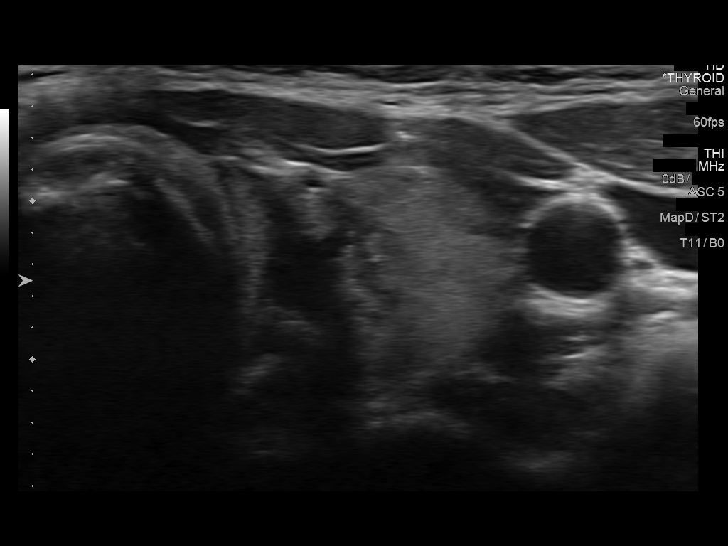
[im 31/46]
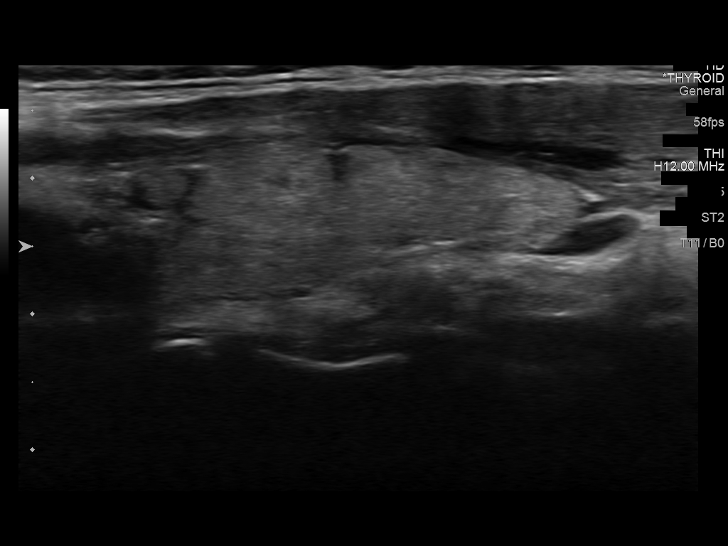
[im 34/46]
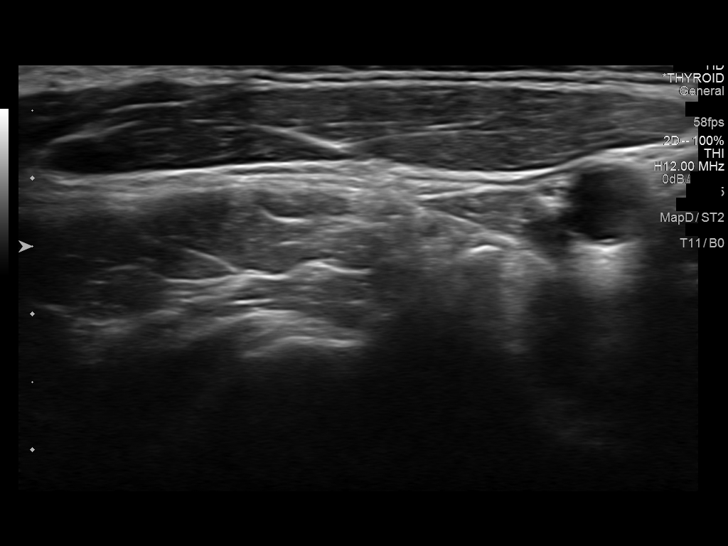
[im 38/46]
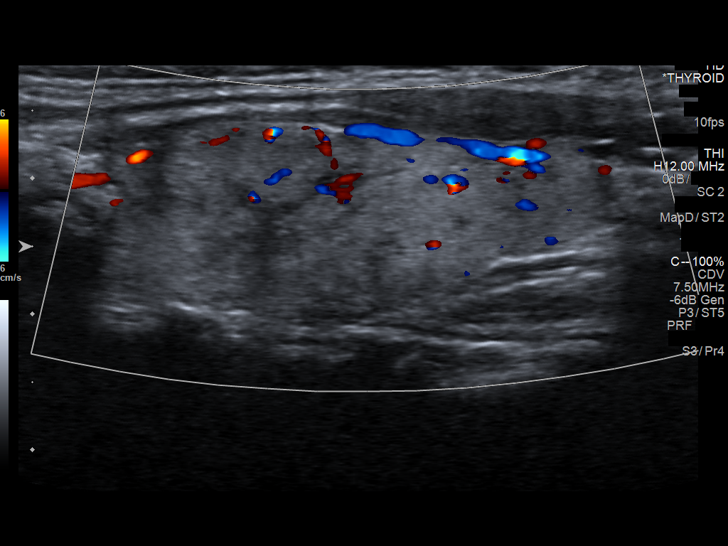
[im 42/46]
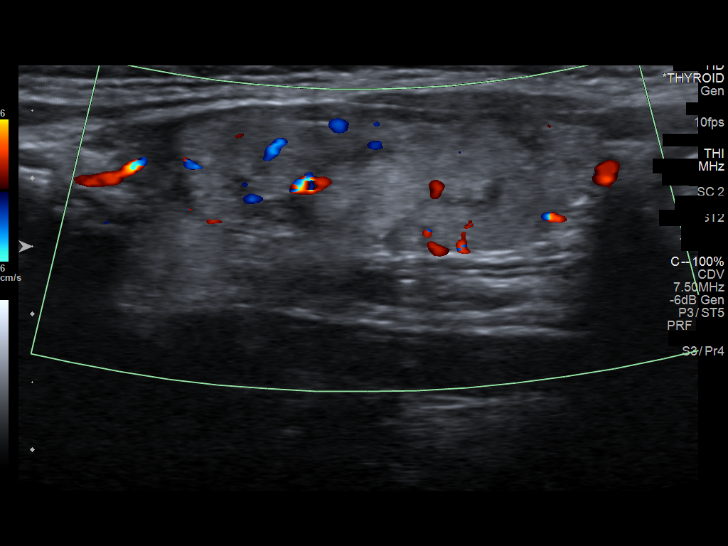
[im 46/46]
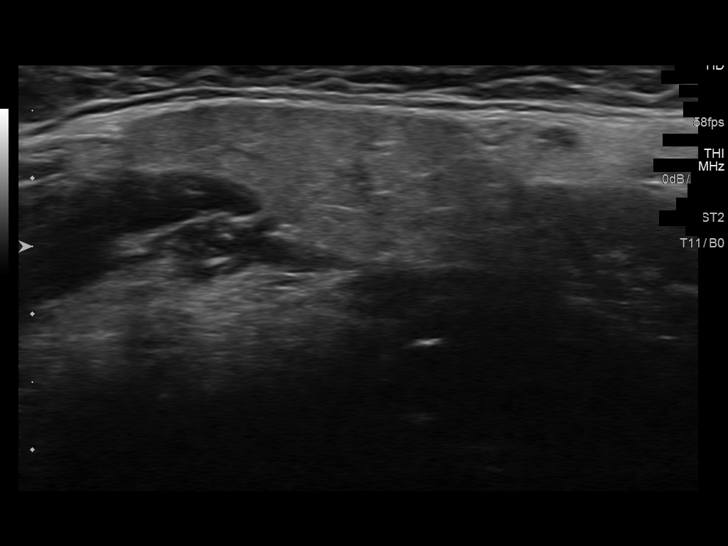

[13 of 25 positions shown; findings below may reference images not displayed]

FINDINGS: Parenchymal Echotexture: Moderately heterogenous

Isthmus: 0.3 cm

Right lobe: 4.7 x 1.8 x 1.3 cm

Left lobe: 3.9 x 1.4 x 1.6 cm

_________________________________________________________

Estimated total number of nodules >/= 1 cm: 0

Number of spongiform nodules >/=  2 cm not described below (TR1): 0

Number of mixed cystic and solid nodules >/= 1.5 cm not described
below (TR2): 0

_________________________________________________________

Overall gland size mildly enlarged, especially on the right.
Parenchymal heterogeneity suggests the possibility of underlying
thyroiditis and correlation suggested with thyroid function tests.
No discrete nodules are seen within the thyroid gland. No abnormal
lymph nodes identified.
IMPRESSION: Mildly enlarged thyroid gland with diffuse parenchymal heterogeneity
suggesting the possibility of underlying thyroiditis. Correlation
with thyroid function tests recommended.

The above is in keeping with the ACR TI-RADS recommendations - [HOSPITAL] 4934;[DATE].

## 2020-06-12 ENCOUNTER — Encounter: Payer: Self-pay | Admitting: Medical-Surgical

## 2020-06-12 ENCOUNTER — Telehealth (INDEPENDENT_AMBULATORY_CARE_PROVIDER_SITE_OTHER): Payer: PRIVATE HEALTH INSURANCE | Admitting: Medical-Surgical

## 2020-06-12 DIAGNOSIS — R590 Localized enlarged lymph nodes: Secondary | ICD-10-CM

## 2020-06-12 NOTE — Progress Notes (Signed)
Virtual Visit via Video Note  I connected with Candice Callahan on 06/12/20 at 11:10 AM EDT by a video enabled telemedicine application and verified that I am speaking with the correct person using two identifiers.   I discussed the limitations of evaluation and management by telemedicine and the availability of in person appointments. The patient expressed understanding and agreed to proceed.  Patient location: home Provider locations: office  Subjective:    CC: swollen lymph nodes  HPI: Pleasant 29 year old female presenting via MyChart video visit with reports of swollen lymph nodes along the right side of her neck for the last  3-4 days. She reports having tested positive for COVID 3.5 weeks ago with symptoms that last for 3-4 days before resolution. She did not require medications or hospitalization . Shortly after that, she developed a sore throat but thought this was just related to COVID. She also has an intermittent dry cough that stems from throat irritation. No sinus congestion, ear pain/pressure, fever, chills, shortness of breath, chest pain, or GI symptoms. Able to eat and drink okay but reports she can feel the swelling when she swallows. Swallowing is not painful. Has had mono in the past when she was young. No known contact with anyone recently who has strep but she does work in contact with a lot of people. Has not taken any medications or tried any treatments for her symptoms. Has not looked at her throat with a flashlight.  Past medical history, Surgical history, Family history not pertinant except as noted below, Social history, Allergies, and medications have been entered into the medical record, reviewed, and corrections made.   Review of Systems: See HPI for pertinent positives and negatives.   Objective:    General: Speaking clearly in complete sentences without any shortness of breath.  Alert and oriented x3.  Normal judgment. No apparent acute distress.  Impression and  Recommendations:    1. Cervical lymphadenopathy Checking CBC with differential. Will do a drive-up strep swab. History of Mono so no need for further testing. Consider strep pharyngitis, post-COVID syndrome, or viral pharyngitis. Possible lymphadenitis but suspicion is not strong for this. - CBC with Differential  20 minutes of non face-to-face time was provided during this encounter.  Return if symptoms worsen or fail to improve.  I discussed the assessment and treatment plan with the patient. The patient was provided an opportunity to ask questions and all were answered. The patient agreed with the plan and demonstrated an understanding of the instructions.   The patient was advised to call back or seek an in-person evaluation if the symptoms worsen or if the condition fails to improve as anticipated.  Thayer Ohm, DNP, APRN, FNP-BC [facility]

## 2020-06-13 LAB — CBC WITH DIFFERENTIAL/PLATELET
Absolute Monocytes: 312 cells/uL (ref 200–950)
Basophils Absolute: 53 cells/uL (ref 0–200)
Basophils Relative: 1.1 %
Eosinophils Absolute: 82 cells/uL (ref 15–500)
Eosinophils Relative: 1.7 %
HCT: 38.9 % (ref 35.0–45.0)
Hemoglobin: 12.8 g/dL (ref 11.7–15.5)
Lymphs Abs: 1843 cells/uL (ref 850–3900)
MCH: 28.5 pg (ref 27.0–33.0)
MCHC: 32.9 g/dL (ref 32.0–36.0)
MCV: 86.6 fL (ref 80.0–100.0)
MPV: 10.3 fL (ref 7.5–12.5)
Monocytes Relative: 6.5 %
Neutro Abs: 2510 cells/uL (ref 1500–7800)
Neutrophils Relative %: 52.3 %
Platelets: 245 10*3/uL (ref 140–400)
RBC: 4.49 10*6/uL (ref 3.80–5.10)
RDW: 11.5 % (ref 11.0–15.0)
Total Lymphocyte: 38.4 %
WBC: 4.8 10*3/uL (ref 3.8–10.8)

## 2020-12-16 ENCOUNTER — Telehealth (INDEPENDENT_AMBULATORY_CARE_PROVIDER_SITE_OTHER): Payer: PRIVATE HEALTH INSURANCE | Admitting: Medical-Surgical

## 2020-12-16 ENCOUNTER — Encounter: Payer: Self-pay | Admitting: Medical-Surgical

## 2020-12-16 VITALS — Temp 98.3°F

## 2020-12-16 DIAGNOSIS — J029 Acute pharyngitis, unspecified: Secondary | ICD-10-CM

## 2020-12-16 LAB — POCT RAPID STREP A (OFFICE): Rapid Strep A Screen: NEGATIVE

## 2020-12-16 NOTE — Progress Notes (Signed)
Pt Negative for Strep, I have contacted pt and notified her of results.

## 2020-12-16 NOTE — Progress Notes (Signed)
Virtual Visit via Video Note  I connected with Candice Callahan on 12/16/20 at  9:30 AM EDT by a video enabled telemedicine application and verified that I am speaking with the correct person using two identifiers.   I discussed the limitations of evaluation and management by telemedicine and the availability of in person appointments. The patient expressed understanding and agreed to proceed.  Patient location: home Provider locations: office  Subjective:    CC: sore throat  HPI: Pleasant 30 year old female presenting via MyChart video visit with complaints of sore throat that started yesterday morning.  She monitored for other symptoms yesterday but none developed.  She woke this morning and her sore throat is still present she has very little sinus congestion and only a mild nonproductive cough.  Does feel like there is mild cervical lymphadenopathy worse on the right side than the left.  Denies fever, chills, ear pain, sneezing, coughing, and seasonal allergies.  Works at [employer] but has not had any known exposures to sick contacts.  Has not looked in her throat so is not sure if her tonsils are swollen or if she has any exudate present.  Wants to make sure that she does not have strep throat before she goes back to work.  Does not take allergy medications regularly.  Past medical history, Surgical history, Family history not pertinant except as noted below, Social history, Allergies, and medications have been entered into the medical record, reviewed, and corrections made.   Review of Systems: See HPI for pertinent positives and negatives.   Objective:    General: Speaking clearly in complete sentences without any shortness of breath.  Alert and oriented x3.  Normal judgment. No apparent acute distress.  Impression and Recommendations:    1. Sore throat Suspect this is likely related to postnasal drip and seasonal allergies.  We will go ahead and bring her in for a drive up strep  swab to be safe.  Low suspicion for upper respiratory viral illnesses like flu or COVID in the setting of no other symptoms.  Discussed symptomatic treatment with Tylenol, ibuprofen, warm fluids, salt water gargles, and sore throat lozenges.  I discussed the assessment and treatment plan with the patient. The patient was provided an opportunity to ask questions and all were answered. The patient agreed with the plan and demonstrated an understanding of the instructions.   The patient was advised to call back or seek an in-person evaluation if the symptoms worsen or if the condition fails to improve as anticipated.  20 minutes of non-face-to-face time was provided during this encounter.  Return if symptoms worsen or fail to improve.  Thayer Ohm, DNP, APRN, FNP-BC [facility]

## 2020-12-16 NOTE — Addendum Note (Signed)
Addended by: Gonzella Lex R on: 12/16/2020 11:29 AM   Modules accepted: Orders

## 2022-08-31 ENCOUNTER — Ambulatory Visit (INDEPENDENT_AMBULATORY_CARE_PROVIDER_SITE_OTHER): Payer: BLUE CROSS/BLUE SHIELD | Admitting: Family Medicine

## 2022-08-31 ENCOUNTER — Encounter: Payer: Self-pay | Admitting: Family Medicine

## 2022-08-31 VITALS — BP 113/71 | HR 65 | Ht 63.0 in | Wt 123.0 lb

## 2022-08-31 DIAGNOSIS — F32A Depression, unspecified: Secondary | ICD-10-CM | POA: Diagnosis not present

## 2022-08-31 DIAGNOSIS — F649 Gender identity disorder, unspecified: Secondary | ICD-10-CM | POA: Diagnosis not present

## 2022-08-31 DIAGNOSIS — G43909 Migraine, unspecified, not intractable, without status migrainosus: Secondary | ICD-10-CM

## 2022-08-31 MED ORDER — TOPIRAMATE 25 MG PO TABS: 25.0000 mg | ORAL_TABLET | Freq: Two times a day (BID) | ORAL | 0 refills | Status: DC

## 2022-08-31 MED ORDER — FLUOXETINE HCL 20 MG PO TABS: ORAL_TABLET | ORAL | 3 refills | Status: DC

## 2022-08-31 MED ORDER — ELETRIPTAN HYDROBROMIDE 20 MG PO TABS: 20.0000 mg | ORAL_TABLET | ORAL | 3 refills | Status: DC | PRN

## 2022-08-31 NOTE — Progress Notes (Signed)
   Established Patient Office Visit  Subjective   Patient ID: Candice Callahan, adult    DOB: April 17, 1991  Age: 32 y.o. MRN: [id number]  Chief Complaint  Patient presents with   mood    HPI  Candice Callahan is here to discuss restarting medication.  Unfortunately they lost insurance and so was unable to continue the fluoxetine as well as migraine medications.  Candice Callahan has noticed that more recently they have been struggling with mood particularly anxiety in the last 3 to 4 weeks.  No specific triggers.  Waking up more frequently at night.  Not currently exercising.  Follow-up migraine headaches-getting 2 to 3/month on average.  Not currently on any counter medication or prophylaxis.  Mostly relying on ibuprofen for rescue.  Sometimes it decreases the intensity of the headache but then it comes back a few hours later.  Ehler discussed that they are also interested in starting testosterone therapy to transition.  In the future also potentially interested in mastectomy.    ROS    Objective:     BP 113/71   Pulse 65   Ht 5\' 3"  (1.6 m)   Wt 123 lb (55.8 kg)   SpO2 98%   BMI 21.79 kg/m    Physical Exam Vitals and nursing note reviewed.  Constitutional:      Appearance: Candice Callahan is well-developed.  HENT:     Head: Normocephalic and atraumatic.  Cardiovascular:     Rate and Rhythm: Normal rate and regular rhythm.     Heart sounds: Normal heart sounds.  Pulmonary:     Effort: Pulmonary effort is normal.     Breath sounds: Normal breath sounds.  Skin:    General: Skin is warm and dry.  Neurological:     Mental Status: Candice Callahan is alert and oriented to person, place, and time.  Psychiatric:        Behavior: Behavior normal.     No results found for any visits on 08/31/22.    The ASCVD Risk score (Arnett DK, et al., 2019) failed to calculate for the following reasons:   The 2019 ASCVD risk score is only valid for ages 58 to 39    Assessment & Plan:   Problem List  Items Addressed This Visit       Cardiovascular and Mediastinum   Migraine headache - Primary    Must take start topiramate.  Can adjust dose after couple months if needed.      Relevant Medications   FLUoxetine (PROZAC) 20 MG tablet   topiramate (TOPAMAX) 25 MG tablet   eletriptan (RELPAX) 20 MG tablet     Other   Gender dysphoria    We discussed potential new start of testosterone therapy.  Will go into more detail at next office visit.  Did discuss potential side effects of the medication and monitoring that would be required.  Candice Callahan is not interested in future childbearing.      Acute depression    Discussed options.  Will restart fluoxetine 10 mg for 10 days then up to 20 mg.  Follow-up in about 6 to 8 weeks.      Relevant Medications   FLUoxetine (PROZAC) 20 MG tablet    Return in about 7 weeks (around 10/19/2022) for New start medication.    [name], MD

## 2022-08-31 NOTE — Assessment & Plan Note (Signed)
Must take start topiramate.  Can adjust dose after couple months if needed.

## 2022-08-31 NOTE — Assessment & Plan Note (Signed)
We discussed potential new start of testosterone therapy.  Will go into more detail at next office visit.  Did discuss potential side effects of the medication and monitoring that would be required.  Der is not interested in future childbearing.

## 2022-08-31 NOTE — Assessment & Plan Note (Signed)
Discussed options.  Will restart fluoxetine 10 mg for 10 days then up to 20 mg.  Follow-up in about 6 to 8 weeks.

## 2022-09-15 ENCOUNTER — Telehealth: Payer: Self-pay

## 2022-09-15 NOTE — Telephone Encounter (Addendum)
Initiated Prior authorization [id number] (PROZAC) 20 MG tablet Via: Covermymeds Case/Key:[id number] Status: approved as of 09/15/22 Reason:Authorization Expiration Date: 09/14/2023 Notified Pt via: Mychart

## 2022-10-14 ENCOUNTER — Telehealth: Payer: Self-pay | Admitting: *Deleted

## 2022-10-14 DIAGNOSIS — F649 Gender identity disorder, unspecified: Secondary | ICD-10-CM

## 2022-10-14 DIAGNOSIS — F640 Transsexualism: Secondary | ICD-10-CM

## 2022-10-14 NOTE — Telephone Encounter (Signed)
Pt came in today for labs before startin  Orders Placed This Encounter  Procedures   Estradiol   [place]   LH   Progesterone   g testosterone therapy.

## 2022-10-19 ENCOUNTER — Ambulatory Visit (INDEPENDENT_AMBULATORY_CARE_PROVIDER_SITE_OTHER): Payer: BC Managed Care – PPO | Admitting: Family Medicine

## 2022-10-19 ENCOUNTER — Encounter: Payer: Self-pay | Admitting: Family Medicine

## 2022-10-19 VITALS — BP 107/62 | HR 79 | Ht 63.0 in | Wt 122.0 lb

## 2022-10-19 DIAGNOSIS — F649 Gender identity disorder, unspecified: Secondary | ICD-10-CM | POA: Diagnosis not present

## 2022-10-19 DIAGNOSIS — F640 Transsexualism: Secondary | ICD-10-CM

## 2022-10-19 DIAGNOSIS — R799 Abnormal finding of blood chemistry, unspecified: Secondary | ICD-10-CM

## 2022-10-19 DIAGNOSIS — Z79899 Other long term (current) drug therapy: Secondary | ICD-10-CM

## 2022-10-19 MED ORDER — TESTOSTERONE 20.25 MG/ACT (1.62%) TD GEL: 1.0000 | Freq: Every morning | TRANSDERMAL | 3 refills | Status: DC

## 2022-10-19 NOTE — Assessment & Plan Note (Addendum)
Candice Callahan has made an informed decision about their goal and wishes.  Goals of testosterone therapy discussed.  Discussed options for testosterone therapy including topicals versus injection.  Will initiate topical.  May require prior authorization from the insurance. Side effects of treatment discussed.  Including but not limited to abnormal periods, pelvic pain, polycythemia ,  possible increased cardiovascular risk, etc. No future fertility desire.  IUD in place Start Androgel 1 pump daily in AM F/U Q 3 months for the first year. Then Q 6 months.  Check  CBC, CMP, Test, Estradiol at next OV Check CBC, CMP, lipids in 6 months.

## 2022-10-19 NOTE — Progress Notes (Signed)
   Established Patient Office Visit  Subjective   Patient ID: Candice Callahan, adult    DOB: Sep 01, 1990  Age: 32 y.o. MRN: [id number]  Chief Complaint  Patient presents with   Depression    HPI Candice Callahan is here today for follow-up to discuss initiation of testosterone therapy for gender dysphoria.  Candice Callahan would like to see facial hair growth as well as the ability to increase muscle mass.  Candice Callahan is currently sexually active with a female and has an IUD in place.  Not wanting any fertility in the future.  Only underlying mood disorder is depression and it is currently well-controlled on current therapy.  History of migraine headaches.     ROS    Objective:     BP 107/62   Pulse 79   Ht 5' 3"$  (1.6 m)   Wt 122 lb (55.3 kg)   SpO2 100%   BMI 21.61 kg/m    Physical Exam Vitals reviewed.  Constitutional:      Appearance: Candice Callahan is well-developed.  HENT:     Head: Normocephalic and atraumatic.  Eyes:     Conjunctiva/sclera: Conjunctivae normal.  Cardiovascular:     Rate and Rhythm: Normal rate.  Pulmonary:     Effort: Pulmonary effort is normal.  Skin:    General: Skin is dry.     Coloration: Skin is not pale.  Neurological:     Mental Status: Candice Callahan is alert and oriented to person, place, and time.  Psychiatric:        Behavior: Behavior normal.      No results found for any visits on 10/19/22.    The ASCVD Risk score (Arnett DK, et al., 2019) failed to calculate for the following reasons:   The 2019 ASCVD risk score is only valid for ages 54 to 84    Assessment & Plan:   Problem List Items Addressed This Visit       Other   Gender dysphoria - Primary    Candice Callahan has made an informed decision about their goal and wishes.  Goals of testosterone therapy discussed.  Discussed options for testosterone therapy including topicals versus injection.  Will initiate topical.  May require prior authorization from the insurance. Side effects of treatment  discussed.  Including but not limited to abnormal periods, pelvic pain, polycythemia ,  possible increased cardiovascular risk, etc. No future fertility desire.  IUD in place Start Androgel 1 pump daily in AM F/U Q 3 months for the first year. Then Q 6 months.  Check  CBC, CMP, Test, Estradiol at next OV Check CBC, CMP, lipids in 6 months.        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    Candice Lecher, MD

## 2022-10-20 NOTE — Progress Notes (Signed)
Calcium is still high. Not as high as before but still high. I would ike to check your parathyroid level.  I believe the sample has to be frozen but we will contact the lab and see if can be added on but we may have to have drawn.

## 2022-10-22 LAB — COMPLETE METABOLIC PANEL WITH GFR
AG Ratio: 1.4 (calc) (ref 1.0–2.5)
ALT: 15 U/L (ref 6–29)
AST: 16 U/L (ref 10–30)
Albumin: 4.4 g/dL (ref 3.6–5.1)
Alkaline phosphatase (APISO): 47 U/L (ref 31–125)
BUN: 9 mg/dL (ref 7–25)
CO2: 26 mmol/L (ref 20–32)
Calcium: 10.7 mg/dL — ABNORMAL HIGH (ref 8.6–10.2)
Chloride: 108 mmol/L (ref 98–110)
Creat: 0.69 mg/dL (ref 0.50–0.96)
Globulin: 3.1 g/dL (calc) (ref 1.9–3.7)
Glucose, Bld: 94 mg/dL (ref 65–99)
Potassium: 4.2 mmol/L (ref 3.5–5.3)
Sodium: 140 mmol/L (ref 135–146)
Total Bilirubin: 0.3 mg/dL (ref 0.2–1.2)
Total Protein: 7.5 g/dL (ref 6.1–8.1)
eGFR: 120 mL/min/{1.73_m2} (ref >=60)

## 2022-10-22 LAB — TESTOSTERONE, TOTAL, LC/MS/MS: Testosterone, Total, LC-MS-MS: 29 ng/dL (ref 2–45)

## 2022-10-22 LAB — LIPID PANEL
Cholesterol: 167 mg/dL (ref <200)
HDL: 57 mg/dL (ref >=50)
LDL Cholesterol (Calc): 96 mg/dL (calc)
Non-HDL Cholesterol (Calc): 110 mg/dL (calc) (ref <130)
Total CHOL/HDL Ratio: 2.9 (calc) (ref <5.0)
Triglycerides: 58 mg/dL (ref <150)

## 2022-10-22 LAB — CBC
HCT: 40.6 % (ref 35.0–45.0)
Hemoglobin: 13.4 g/dL (ref 11.7–15.5)
MCH: 28.6 pg (ref 27.0–33.0)
MCHC: 33 g/dL (ref 32.0–36.0)
MCV: 86.6 fL (ref 80.0–100.0)
MPV: 10.8 fL (ref 7.5–12.5)
Platelets: 248 10*3/uL (ref 140–400)
RBC: 4.69 10*6/uL (ref 3.80–5.10)
RDW: 11.6 % (ref 11.0–15.0)
WBC: 3.3 10*3/uL — ABNORMAL LOW (ref 3.8–10.8)

## 2022-10-22 LAB — ESTRADIOL: Estradiol: 28 pg/mL

## 2022-10-26 NOTE — Progress Notes (Signed)
It looks like we were not able to add the PTH.  Please reorder and notify patient that they will have to come back by to have drawn.

## 2022-10-27 NOTE — Addendum Note (Signed)
Addended by: Rae Lips on: 10/27/2022 10:54 AM   Modules accepted: Orders

## 2022-11-17 DIAGNOSIS — E049 Nontoxic goiter, unspecified: Secondary | ICD-10-CM | POA: Diagnosis not present

## 2022-11-17 DIAGNOSIS — Z1151 Encounter for screening for human papillomavirus (HPV): Secondary | ICD-10-CM | POA: Diagnosis not present

## 2022-11-17 DIAGNOSIS — Z01419 Encounter for gynecological examination (general) (routine) without abnormal findings: Secondary | ICD-10-CM | POA: Diagnosis not present

## 2022-11-17 LAB — RESULTS CONSOLE HPV: CHL HPV: NEGATIVE

## 2022-11-17 LAB — HM PAP SMEAR: HM Pap smear: NORMAL

## 2023-01-18 ENCOUNTER — Ambulatory Visit: Payer: BC Managed Care – PPO | Admitting: Family Medicine

## 2023-03-01 ENCOUNTER — Ambulatory Visit (INDEPENDENT_AMBULATORY_CARE_PROVIDER_SITE_OTHER): Payer: BC Managed Care – PPO | Admitting: Family Medicine

## 2023-03-01 ENCOUNTER — Encounter: Payer: Self-pay | Admitting: Family Medicine

## 2023-03-01 VITALS — BP 121/81 | HR 80 | Ht 63.0 in | Wt 121.0 lb

## 2023-03-01 DIAGNOSIS — F640 Transsexualism: Secondary | ICD-10-CM | POA: Diagnosis not present

## 2023-03-01 DIAGNOSIS — G43909 Migraine, unspecified, not intractable, without status migrainosus: Secondary | ICD-10-CM | POA: Diagnosis not present

## 2023-03-01 DIAGNOSIS — F649 Gender identity disorder, unspecified: Secondary | ICD-10-CM | POA: Diagnosis not present

## 2023-03-01 DIAGNOSIS — Z113 Encounter for screening for infections with a predominantly sexual mode of transmission: Secondary | ICD-10-CM

## 2023-03-01 DIAGNOSIS — R799 Abnormal finding of blood chemistry, unspecified: Secondary | ICD-10-CM

## 2023-03-01 DIAGNOSIS — Z79899 Other long term (current) drug therapy: Secondary | ICD-10-CM

## 2023-03-01 MED ORDER — KETOROLAC TROMETHAMINE 60 MG/2ML IM SOLN
60.00 mg | Freq: Once | INTRAMUSCULAR | Status: AC
Start: 2023-03-01 — End: 2023-03-01
  Administered 2023-03-01: 60 mg via INTRAMUSCULAR

## 2023-03-01 MED ORDER — RIZATRIPTAN BENZOATE 10 MG PO TBDP
10.0000 mg | ORAL_TABLET | ORAL | 0 refills | Status: DC | PRN
Start: 2023-03-01 — End: 2023-06-02

## 2023-03-01 MED ORDER — EMGALITY 120 MG/ML ~~LOC~~ SOAJ
240.00 mg | Freq: Once | SUBCUTANEOUS | 5 refills | Status: AC
Start: 2023-03-01 — End: 2023-03-01

## 2023-03-01 MED ORDER — PROMETHAZINE HCL 25 MG/ML IJ SOLN
25.0000 mg | Freq: Once | INTRAMUSCULAR | Status: AC
Start: 2023-03-01 — End: 2023-03-01
  Administered 2023-03-01: 25 mg via INTRAMUSCULAR

## 2023-03-01 MED ORDER — TOPIRAMATE 50 MG PO TABS
50.0000 mg | ORAL_TABLET | Freq: Two times a day (BID) | ORAL | 0 refills | Status: DC
Start: 2023-03-01 — End: 2023-06-02

## 2023-03-01 NOTE — Progress Notes (Signed)
Established Patient Office Visit  Subjective   Patient ID: Candice Callahan, adult    DOB: [birth year]  Age: 32 y.o. MRN: [id number]  Chief Complaint  Patient presents with   Migraine         HPI  F/U migraines headaches.  They are interested in one of the newer injectables. Has had about 7- 8 migraines in the past 2 months on topamax. woke up with migraine this AM and gets really nauseated with it.  In fact this morning while here in the office feeling very nauseated and even vomited.  Currently on topiramate 25 mg twice a day and using Relpax as needed for rescue.  Relpax seems to help some of the time but not consistently.  F.U Gender dysphoria -doing well with testosterone replacement therapy.  Currently on the topical gel.  Due for updated labs.    ROS    Objective:     BP 121/81   Pulse 80   Ht 5\' 3"  (1.6 m)   Wt 121 lb (54.9 kg)   SpO2 96%   BMI 21.43 kg/m    Physical Exam Vitals reviewed.  Constitutional:      Appearance: Candice Callahan is well-developed.  HENT:     Head: Normocephalic and atraumatic.  Eyes:     Conjunctiva/sclera: Conjunctivae normal.  Cardiovascular:     Rate and Rhythm: Normal rate.  Pulmonary:     Effort: Pulmonary effort is normal.  Skin:    General: Skin is dry.     Coloration: Skin is not pale.  Neurological:     Mental Status: Candice Callahan is alert and oriented to person, place, and time.  Psychiatric:        Behavior: Behavior normal.      Results for orders placed or performed in visit on 03/01/23  HM PAP SMEAR  Result Value Ref Range   HM Pap smear normal   Results Console HPV  Result Value Ref Range   CHL HPV Negative       The ASCVD Risk score (Arnett DK, et al., 2019) failed to calculate for the following reasons:   The 2019 ASCVD risk score is only valid for ages 70 to 63    Assessment & Plan:   Problem List Items Addressed This Visit       Cardiovascular and Mediastinum   Migraine headache     Discussed options.  In the short-term we will increase topiramate to 50 mg and will try to see if we can get Emgality covered.  But it may take a week or 2 for [place] to get that authorized with the insurance.  Will switch to Maxalt melts for rescue to see if this is more helpful.  Otherwise follow-up in 3 months.      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  Will come back for labs later this week since they are feeling poorly. Continue current testosterone dosage.      Relevant Orders   Testosterone, Total, LC/MS/MS   Estradiol   CBC   Other Visit Diagnoses     Abnormal blood chemistry       Relevant Orders  PTH, Intact and Calcium   Serum calcium elevated       Relevant Orders   PTH, Intact and Calcium   Screen for STD (sexually transmitted disease)       Relevant Orders   HIV antibody (with reflex)   Hepatitis C Antibody   Acute migraine       Relevant Medications   ketorolac (TORADOL) injection 60 mg (Completed)   topiramate (TOPAMAX) 50 MG tablet   rizatriptan (MAXALT-MLT) 10 MG disintegrating tablet   Galcanezumab-gnlm (EMGALITY) 120 MG/ML SOAJ   promethazine (PHENERGAN) injection 25 mg (Completed)      Need to follow-up on recent elevated calcium levels.  Candice Callahan reports that at 1 point they were taking a calcium supplement but not anytime recently.  For acute migraine given Toradol and Phenergan injection here in the office.  Candice Callahan had a friend come to take them home and did not drive.  Return in about 3 months (around 06/01/2023) for Migraines. .   I spent 25 minutes on the day of the encounter to include pre-visit record review, face-to-face time with the patient  and post visit ordering of test.   Candice Gasser, MD

## 2023-03-01 NOTE — Assessment & Plan Note (Addendum)
Due to recheck hormone levels.  Will come back for labs later this week since they are feeling poorly. Continue current testosterone dosage.

## 2023-03-01 NOTE — Assessment & Plan Note (Signed)
Discussed options.  In the short-term we will increase topiramate to 50 mg and will try to see if we can get Emgality covered.  But it may take a week or 2 for [place] to get that authorized with the insurance.  Will switch to Maxalt melts for rescue to see if this is more helpful.  Otherwise follow-up in 3 months.

## 2023-06-02 ENCOUNTER — Encounter: Payer: Self-pay | Admitting: Family Medicine

## 2023-06-02 ENCOUNTER — Ambulatory Visit: Payer: BC Managed Care – PPO | Admitting: Family Medicine

## 2023-06-02 VITALS — BP 110/62 | HR 64 | Ht 63.0 in | Wt 126.0 lb

## 2023-06-02 DIAGNOSIS — Z79899 Other long term (current) drug therapy: Secondary | ICD-10-CM

## 2023-06-02 DIAGNOSIS — G43909 Migraine, unspecified, not intractable, without status migrainosus: Secondary | ICD-10-CM

## 2023-06-02 DIAGNOSIS — F640 Transsexualism: Secondary | ICD-10-CM

## 2023-06-02 DIAGNOSIS — F649 Gender identity disorder, unspecified: Secondary | ICD-10-CM | POA: Diagnosis not present

## 2023-06-02 DIAGNOSIS — F32A Depression, unspecified: Secondary | ICD-10-CM

## 2023-06-02 DIAGNOSIS — Z113 Encounter for screening for infections with a predominantly sexual mode of transmission: Secondary | ICD-10-CM

## 2023-06-02 DIAGNOSIS — E049 Nontoxic goiter, unspecified: Secondary | ICD-10-CM

## 2023-06-02 MED ORDER — RIZATRIPTAN BENZOATE 10 MG PO TBDP
10.0000 mg | ORAL_TABLET | ORAL | 2 refills | Status: DC | PRN
Start: 2023-06-02 — End: 2023-07-24

## 2023-06-02 MED ORDER — TESTOSTERONE 20.25 MG/ACT (1.62%) TD GEL: 1.0000 | Freq: Every morning | TRANSDERMAL | 3 refills | Status: DC

## 2023-06-02 MED ORDER — FLUOXETINE HCL 20 MG PO TABS: 20.0000 mg | ORAL_TABLET | Freq: Every day | ORAL | 3 refills | Status: DC

## 2023-06-02 MED ORDER — TOPIRAMATE 50 MG PO TABS: 50.0000 mg | ORAL_TABLET | Freq: Two times a day (BID) | ORAL | 1 refills | Status: DC

## 2023-06-02 NOTE — Assessment & Plan Note (Signed)
Check TSH today.  Consider getting updated ultrasound the last 1 was done in 2019 so it has been almost 5 years.

## 2023-06-02 NOTE — Assessment & Plan Note (Signed)
Discussed getting the Emgality approved we did call the pharmacy to verify that it does need a prior authorization.  So we will go ahead and initiate that.  Please see prior note.  If approved then recommend return for office visit in about 2 months after starting the new medication.  Will go ahead and get up-to-date labs today.

## 2023-06-02 NOTE — Assessment & Plan Note (Signed)
Will get updated lab work today.

## 2023-06-02 NOTE — Progress Notes (Signed)
Established Patient Office Visit  Subjective   Patient ID: Candice Callahan, adult    DOB: 1991/01/22  Age: 32 y.o. MRN: [id number]  Chief Complaint  Patient presents with   Migraine    HPI  F/U migraine HA - 3 mo f/u.  We were hoping to have the Emgality on board but pharmacy was unable to fill.  We did inc the Topamax to 50 mg instead.  Migraine headaches have reduced from about every 2 weeks to about every 3 to 3-1/2 weeks.  Has used the Maxalt a couple of times has some at work and at home.  Also F/U hormone treatment for transgender  -currently doing testosterone injections.  Concerns or problems.  Feeling well on the medication.  Mood-happy with fluoxetine.  Denies any depressive or anxiety symptoms currently just reports fatigue but feels like that is chronic.    ROS    Objective:     BP 110/62   Pulse 64   Ht 5\' 3"  (1.6 m)   Wt 126 lb (57.2 kg)   SpO2 100%   BMI 22.32 kg/m    Physical Exam Vitals and nursing note reviewed.  Constitutional:      Appearance: Normal appearance.  HENT:     Head: Normocephalic and atraumatic.  Eyes:     Conjunctiva/sclera: Conjunctivae normal.  Neck:     Comments: Thyromegaly  Cardiovascular:     Rate and Rhythm: Normal rate and regular rhythm.  Pulmonary:     Effort: Pulmonary effort is normal.     Breath sounds: Normal breath sounds.  Musculoskeletal:     Cervical back: Neck supple.  Skin:    General: Skin is warm and dry.  Neurological:     Mental Status: Spring is alert.  Psychiatric:        Mood and Affect: Mood normal.      No results found for any visits on 06/02/23.    The ASCVD Risk score (Arnett DK, et al., 2019) failed to calculate for the following reasons:   The 2019 ASCVD risk score is only valid for ages 61 to 46    Assessment & Plan:   Problem List Items Addressed This Visit       Cardiovascular and Mediastinum   Migraine headache    Discussed getting the Emgality approved we did call the  pharmacy to verify that it does need a prior authorization.  So we will go ahead and initiate that.  Please see prior note.  If approved then recommend return for office visit in about 2 months after starting the new medication.  Will go ahead and get up-to-date labs today.      Relevant Medications   rizatriptan (MAXALT-MLT) 10 MG disintegrating tablet   FLUoxetine (PROZAC) 20 MG tablet   topiramate (TOPAMAX) 50 MG tablet   Other Relevant Orders   CBC   Estradiol   Testosterone, Total, LC/MS/MS   Hepatitis C Antibody   HIV antibody (with reflex)   PTH, Intact and Calcium   TSH     Endocrine   Goiter    Check TSH today.  Consider getting updated ultrasound the last 1 was done in 2019 so it has been almost 5 years.      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      Relevant  Medications   Testosterone 20.25 MG/ACT (1.62%) GEL   Gender dysphoria   Relevant Medications   Testosterone 20.25 MG/ACT (1.62%) GEL   Other Relevant Orders   CBC   Estradiol   Testosterone, Total, LC/MS/MS   Hepatitis C Antibody   HIV antibody (with reflex)   PTH, Intact and Calcium   TSH   Acute depression   Relevant Medications   FLUoxetine (PROZAC) 20 MG tablet   Other Visit Diagnoses     Screen for STD (sexually transmitted disease)    -  Primary   Relevant Orders   CBC   Estradiol   Testosterone, Total, LC/MS/MS   Hepatitis C Antibody   HIV antibody (with reflex)   PTH, Intact and Calcium   TSH       Return in about 6 months (around 12/01/2023) for Mood.    Nani Gasser, MD

## 2023-06-03 ENCOUNTER — Ambulatory Visit: Payer: BC Managed Care – PPO

## 2023-06-03 DIAGNOSIS — E049 Nontoxic goiter, unspecified: Secondary | ICD-10-CM

## 2023-06-03 DIAGNOSIS — E010 Iodine-deficiency related diffuse (endemic) goiter: Secondary | ICD-10-CM | POA: Diagnosis not present

## 2023-06-03 DIAGNOSIS — R221 Localized swelling, mass and lump, neck: Secondary | ICD-10-CM | POA: Diagnosis not present

## 2023-06-06 NOTE — Progress Notes (Signed)
Enlarged but not concerning findings.

## 2023-06-07 NOTE — Progress Notes (Signed)
Hi Candice Callahan, blood count is normal.  Calcium level is still just a little bit elevated but better than last couple of times it is down to 10.4.  And your parathyroid hormone level looks great.  Negative for hepatitis C and negative for HIV.  Thyroid looks great.  Testosterone still pending.

## 2023-06-09 LAB — ESTRADIOL: Estradiol: 43.9 pg/mL

## 2023-06-09 LAB — CBC
Hematocrit: 42.1 % (ref 34.0–46.6)
Hemoglobin: 13.7 g/dL (ref 11.1–15.9)
MCH: 28.7 pg (ref 26.6–33.0)
MCHC: 32.5 g/dL (ref 31.5–35.7)
MCV: 88 fL (ref 79–97)
Platelets: 239 10*3/uL (ref 150–450)
RBC: 4.78 x10E6/uL (ref 3.77–5.28)
RDW: 11.3 % — ABNORMAL LOW (ref 11.7–15.4)
WBC: 4.4 10*3/uL (ref 3.4–10.8)

## 2023-06-09 LAB — PTH, INTACT AND CALCIUM
Calcium: 10.4 mg/dL — ABNORMAL HIGH (ref 8.7–10.2)
PTH: 21 pg/mL (ref 15–65)

## 2023-06-09 LAB — TESTOSTERONE, TOTAL, LC/MS/MS: Testosterone, total: 182 ng/dL — ABNORMAL HIGH (ref 10.0–55.0)

## 2023-06-09 LAB — HIV ANTIBODY (ROUTINE TESTING W REFLEX): HIV Screen 4th Generation wRfx: NONREACTIVE

## 2023-06-09 LAB — HEPATITIS C ANTIBODY: Hep C Virus Ab: NONREACTIVE

## 2023-06-09 LAB — TSH: TSH: 1.51 u[IU]/mL (ref 0.450–4.500)

## 2023-06-09 NOTE — Progress Notes (Signed)
Hi Alonda, testosterone is okay.

## 2023-07-22 ENCOUNTER — Other Ambulatory Visit: Payer: Self-pay | Admitting: Family Medicine

## 2023-07-22 DIAGNOSIS — G43909 Migraine, unspecified, not intractable, without status migrainosus: Secondary | ICD-10-CM

## 2023-07-26 NOTE — Telephone Encounter (Signed)
Key, can you check on this for the patient when you have a moment please. I believe that this will require a PA. Thanks!!

## 2023-07-26 NOTE — Telephone Encounter (Signed)
Copied from CRM [id number]. Topic: Clinical - Medication Question >> Jul 26, 2023 12:36 PM Donita Brooks wrote: Reason for CRM: pt is calling in about rx emgality -120mg . it's been back and fourth for some time now with the rx. Pt connected Phamarcy and Phamarcy still needs to hear from doctor. Pt would like to follow up about rx

## 2023-08-30 DIAGNOSIS — J069 Acute upper respiratory infection, unspecified: Secondary | ICD-10-CM | POA: Diagnosis not present

## 2023-08-30 DIAGNOSIS — R059 Cough, unspecified: Secondary | ICD-10-CM | POA: Diagnosis not present

## 2023-08-30 DIAGNOSIS — Z8616 Personal history of COVID-19: Secondary | ICD-10-CM | POA: Diagnosis not present

## 2023-10-07 ENCOUNTER — Telehealth: Payer: Self-pay

## 2023-10-07 NOTE — Telephone Encounter (Signed)
Prior auth for: FLUOXETINE  Determination: APPROVED Auth #: [id number] / [id number] Valid from: 10/07/23 - 10/06/14 Patient notified via MyChart

## 2023-12-01 ENCOUNTER — Ambulatory Visit: Payer: BC Managed Care – PPO | Admitting: Family Medicine

## 2023-12-01 NOTE — Progress Notes (Deleted)
   Established Patient Office Visit  Subjective  Patient ID: Candice Callahan, adult    DOB: 11-26-1990  Age: 33 y.o. MRN: [id number]  No chief complaint on file.   HPI  For 37-month follow-up for depression/anxiety-currently on fluoxetine.  {History (Optional):23778}  ROS    Objective:     There were no vitals taken for this visit. {Vitals History (Optional):23777}  Physical Exam Vitals and nursing note reviewed.  Constitutional:      Appearance: Normal appearance.  HENT:     Head: Normocephalic and atraumatic.  Eyes:     Conjunctiva/sclera: Conjunctivae normal.  Cardiovascular:     Rate and Rhythm: Normal rate and regular rhythm.  Pulmonary:     Effort: Pulmonary effort is normal.     Breath sounds: Normal breath sounds.  Skin:    General: Skin is warm and dry.  Neurological:     Mental Status: Candice Callahan is alert.  Psychiatric:        Mood and Affect: Mood normal.    No results found for any visits on 12/01/23.  {Labs (Optional):23779}  The ASCVD Risk score (Arnett DK, et al., 2019) failed to calculate for the following reasons:   The 2019 ASCVD risk score is only valid for ages 43 to 16    Assessment & Plan:   Problem List Items Addressed This Visit       Other   Acute depression - Primary    No follow-ups on file.    Nani Gasser, MD

## 2024-06-02 DIAGNOSIS — R112 Nausea with vomiting, unspecified: Secondary | ICD-10-CM | POA: Diagnosis not present

## 2024-06-02 DIAGNOSIS — J069 Acute upper respiratory infection, unspecified: Secondary | ICD-10-CM | POA: Diagnosis not present

## 2024-07-12 LAB — RESULTS CONSOLE HPV: CHL HPV: NEGATIVE

## 2024-07-12 LAB — HM PAP SMEAR: HM Pap smear: NORMAL

## 2024-08-28 ENCOUNTER — Ambulatory Visit: Admitting: Family Medicine

## 2024-08-30 ENCOUNTER — Ambulatory Visit: Admitting: Family Medicine

## 2024-08-30 VITALS — BP 117/58 | HR 73 | Ht 63.0 in | Wt 133.0 lb

## 2024-08-30 DIAGNOSIS — Z23 Encounter for immunization: Secondary | ICD-10-CM

## 2024-08-30 DIAGNOSIS — F640 Transsexualism: Secondary | ICD-10-CM | POA: Diagnosis not present

## 2024-08-30 DIAGNOSIS — G43909 Migraine, unspecified, not intractable, without status migrainosus: Secondary | ICD-10-CM | POA: Diagnosis not present

## 2024-08-30 DIAGNOSIS — F32A Depression, unspecified: Secondary | ICD-10-CM | POA: Diagnosis not present

## 2024-08-30 DIAGNOSIS — Z79899 Other long term (current) drug therapy: Secondary | ICD-10-CM | POA: Diagnosis not present

## 2024-08-30 DIAGNOSIS — F649 Gender identity disorder, unspecified: Secondary | ICD-10-CM | POA: Diagnosis not present

## 2024-08-30 MED ORDER — TOPIRAMATE 50 MG PO TABS: 50.0000 mg | ORAL_TABLET | Freq: Two times a day (BID) | ORAL | 1 refills | Status: AC

## 2024-08-30 MED ORDER — FLUOXETINE HCL 20 MG PO TABS: 20.0000 mg | ORAL_TABLET | Freq: Every day | ORAL | 3 refills | Status: AC

## 2024-08-30 MED ORDER — TESTOSTERONE 20.25 MG/ACT (1.62%) TD GEL: 1.0000 | Freq: Every morning | TRANSDERMAL | 3 refills | Status: AC

## 2024-08-30 MED ORDER — RIZATRIPTAN BENZOATE 10 MG PO TBDP: ORAL_TABLET | ORAL | 11 refills | Status: AC

## 2024-08-30 NOTE — Progress Notes (Signed)
 "  Established Patient Office Visit  Patient ID: Candice Callahan, adult    DOB: [birth year]  Age: 34 y.o. MRN: [id number] PCP: Alvan Dorothyann BIRCH, MD  Chief Complaint  Patient presents with   Medical Management of Chronic Issues    Subjective:     HPI  Discussed the use of AI scribe software for clinical note transcription with the patient, who gave verbal consent to proceed.  History of Present Illness Candice Callahan is a 34 year old who presents for routine follow-up and blood work.  Psychological stress - Experiencing increased stress related to new employment at [employer] in [place]. 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  Testosterone  therapy - Continues regular testosterone  administration. - Adjusted dosing frequency to every other day due to lapse in insurance coverage, in order to extend supply until new insurance is secured. - Anticipates lower blood testosterone  levels secondary to dosing change.  Migraine management - Migraines have improved significantly. - Manages early symptoms with Excedrin, which usually provides relief. - Uses Maxalt  as backup if Excedrin is ineffective. - Continues Topamax  for migraine prophylaxis.  Physical activity - No regular exercise due to hectic schedule. - Considering gym attendance with a friend who goes late at night.     ROS    Objective:     BP (!) 117/58   Pulse 73   Ht 5' 3 (1.6 m)   Wt 133 lb (60.3 kg)   SpO2 100%   BMI 23.56 kg/m    Physical Exam Vitals and nursing note reviewed.  Constitutional:      Appearance: Normal appearance.  HENT:     Head: Normocephalic and atraumatic.  Eyes:     Conjunctiva/sclera: Conjunctivae normal.  Cardiovascular:     Rate and Rhythm: Normal rate and regular rhythm.  Pulmonary:     Effort: Pulmonary effort is normal.     Breath sounds: Normal breath sounds.  Skin:     General: Skin is warm and dry.  Neurological:     Mental Status: Candice Callahan is alert.  Psychiatric:        Mood and Affect: Mood normal.      Results for orders placed or performed in visit on 08/30/24  HM PAP SMEAR  Result Value Ref Range   HM Pap smear normal   Testosterone , Free, Total, SHBG  Result Value Ref Range   Testosterone  52 8 - 60 ng/dL   Testosterone , Free WILL FOLLOW    Sex Hormone Binding 25.7 24.6 - 122.0 nmol/L  CBC with Differential/Platelet  Result Value Ref Range   WBC 5.2 3.4 - 10.8 x10E3/uL   RBC 4.73 3.77 - 5.28 x10E6/uL   Hemoglobin 13.6 11.1 - 15.9 g/dL   Hematocrit 57.4 65.9 - 46.6 %   MCV 90 79 - 97 fL   MCH 28.8 26.6 - 33.0 pg   MCHC 32.0 31.5 - 35.7 g/dL   RDW 88.5 (L) 88.2 - 84.5 %   Platelets 252 150 - 450 x10E3/uL   Neutrophils 48 Not Estab. %   Lymphs 37 Not Estab. %   Monocytes 9 Not Estab. %   Eos 5 Not Estab. %   Basos 1 Not Estab. %   Neutrophils Absolute 2.4 1.4 - 7.0 x10E3/uL   Lymphocytes Absolute 1.9 0.7 - 3.1 x10E3/uL   Monocytes Absolute 0.5 0.1 - 0.9 x10E3/uL   EOS (ABSOLUTE) 0.3 0.0 - 0.4 x10E3/uL  Basophils Absolute 0.1 0.0 - 0.2 x10E3/uL   Immature Granulocytes 0 Not Estab. %   Immature Grans (Abs) 0.0 0.0 - 0.1 x10E3/uL  CMP14+EGFR  Result Value Ref Range   Glucose 95 70 - 99 mg/dL   BUN 10 6 - 20 mg/dL   Creatinine, Ser 9.32 0.57 - 1.00 mg/dL   eGFR 879 >40 fO/fpw/8.26   BUN/Creatinine Ratio 15 9 - 23   Sodium 140 134 - 144 mmol/L   Potassium 4.1 3.5 - 5.2 mmol/L   Chloride 104 96 - 106 mmol/L   CO2 22 20 - 29 mmol/L   Calcium 10.6 (H) 8.7 - 10.2 mg/dL   Total Protein 7.5 6.0 - 8.5 g/dL   Albumin 4.4 3.9 - 4.9 g/dL   Globulin, Total 3.1 1.5 - 4.5 g/dL   Bilirubin Total 0.2 0.0 - 1.2 mg/dL   Alkaline Phosphatase 52 41 - 116 IU/L   AST 17 0 - 40 IU/L   ALT 15 0 - 32 IU/L  Results Console HPV  Result Value Ref Range   CHL HPV Negative       The ASCVD Risk score (Arnett DK, et al., 2019) failed to calculate  for the following reasons:   The 2019 ASCVD risk score is only valid for ages 60 to 65    Assessment & Plan:   Problem List Items Addressed This Visit       Cardiovascular and Mediastinum   Migraine headache   Relevant Medications   FLUoxetine  (PROZAC ) 20 MG tablet   rizatriptan  (MAXALT -MLT) 10 MG disintegrating tablet   topiramate  (TOPAMAX ) 50 MG tablet     Other   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   Relevant Medications   Testosterone  20.25 MG/ACT (1.62%) GEL   Other Relevant Orders   Testosterone , Free, Total, SHBG (Completed)   CBC with Differential/Platelet (Completed)   CMP14+EGFR (Completed)   Gender dysphoria   Relevant Medications   Testosterone  20.25 MG/ACT (1.62%) GEL   Acute depression   Relevant Medications   FLUoxetine  (PROZAC ) 20 MG tablet   Other Visit Diagnoses       Encounter for immunization       Relevant Orders   Tdap vaccine greater than or equal to 7yo IM (Completed)       Assessment and Plan Assessment & Plan Gender-affirming hormone therapy Currently on testosterone  therapy every other day due to insurance issues. Satisfied with daily results. Discussed newer injectable option, noted expense and prior authorization requirement. - Checked testosterone  blood levels. - Continue current testosterone  regimen.  Migraine Migraines improving. Uses Excedrin for mild symptoms, Maxalt  as backup. Continues Topamax  for prevention. Stress may exacerbate headaches. - Continue Excedrin for mild migraine symptoms. - Use Maxalt  as needed if Excedrin is ineffective. - Continue Topamax  for migraine prevention. - Consider tapering off Topamax  if migraines remain well-controlled.  General Health Maintenance Due for tetanus booster. - Administered tetanus booster.    Return in about 6 months (around 02/27/2025) for Meds and Migraines F/U .    Dorothyann Byars, MD [facility] at [facility]   "

## 2024-08-31 ENCOUNTER — Ambulatory Visit: Payer: Self-pay

## 2024-08-31 ENCOUNTER — Ambulatory Visit: Payer: Self-pay | Admitting: Family Medicine

## 2024-08-31 NOTE — Progress Notes (Signed)
 Hi Candice Callahan, blood count is normal.  Metabolic panel overall looks okay.  Calcium level still just a little borderline elevated but not in a worrisome range.  Liver function is normal.  Total testosterone  is down from last time but that is not surprising as I know you have been spacing your medication.

## 2024-08-31 NOTE — Telephone Encounter (Signed)
 FYI Only or Action Required?: FYI only for provider: ED advised.  Patient was last seen in primary care on 08/30/2024 by Candice Dorothyann BIRCH, MD.  Called Nurse Triage reporting Altered Mental Status.  Symptoms began yesterday.  Interventions attempted: Rest, hydration, or home remedies and Ice/heat application.  Symptoms are: gradually worsening, fluctuating.  Triage Disposition: Go to ED Now (or PCP Triage)  Patient/caregiver understands and will follow disposition?: Yes     Copied from CRM #[id number]. Topic: Clinical - Red Word Triage >> Aug 31, 2024  2:00 PM Candice Callahan wrote: Red Word that prompted transfer to Nurse Triage: Patient reports having passed out at an appointment. Patient states this has never happened before. During the episode, the patient's whole body was shaking, and the patient vomited upon waking. Patient is currently at work, fell afterward, and reports feeling 'off' in the head and shaky      Reason for Disposition  Patient sounds very sick or weak to the triager    Confusion, recent possible fainting or seizure episode.  Additional Information  Negative: Headache or vomiting    Nausea, no headache reported.  Negative: Stiff neck (can't touch chin to chest)    Deferred, not assessed.  Answer Assessment - Initial Assessment Questions See alternative assessment.  Feels like might pass out on/off but has not today (did yesterday).  Confusion is more concerning symptom at this time.  Answer Assessment - Initial Assessment Questions Not feeling like self today, passed out yesterday in doctor's office giving blood.  Was given ice packs, crackers, juice.  No know blood pressure issues then. Patient reports was shaking really bad afterward, reports no history of seizures but legs were shaking and implies this felt like a seizure to her. Has been nauseated, feeling off.    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  Started  working there in May, is a new building.  Denies any fumes or chemical exposures. Denies any mold growth presence at work or where she lives.  Denies any history of water damage or humidity issues she is aware of.  History of migraine headaches.  Protocols used: Fainting-A-AH, Confusion - Delirium-A-AH

## 2024-08-31 NOTE — Telephone Encounter (Signed)
 Attempted call to patient. Left a voicemail message requesting a return call. Left main office and my direct call back numbers.

## 2024-08-31 NOTE — Telephone Encounter (Signed)
 It sounds like they did a good job of giving her medical advice I am also happy to go ahead and just write her out of work for the next couple days so she really truly can just stay home and rest and rehydrate

## 2024-08-31 NOTE — Telephone Encounter (Signed)
 Patient states she currently in [facility] ER - as suggested by HIPOLITO- She has not had any syncopal events since yesterday after giving blood at doctors office.  She is still light headed has had about 20 oz of water today and has not eaten.  I did tell her to let us  know  on Monday 09/04/2023 what is found at ER and if she needs the letter written to be out of work.

## 2024-09-01 LAB — CBC WITH DIFFERENTIAL/PLATELET
Basophils Absolute: 0.1 x10E3/uL (ref 0.0–0.2)
Basos: 1 %
EOS (ABSOLUTE): 0.3 x10E3/uL (ref 0.0–0.4)
Eos: 5 %
Hematocrit: 42.5 % (ref 34.0–46.6)
Hemoglobin: 13.6 g/dL (ref 11.1–15.9)
Immature Grans (Abs): 0 x10E3/uL (ref 0.0–0.1)
Immature Granulocytes: 0 %
Lymphocytes Absolute: 1.9 x10E3/uL (ref 0.7–3.1)
Lymphs: 37 %
MCH: 28.8 pg (ref 26.6–33.0)
MCHC: 32 g/dL (ref 31.5–35.7)
MCV: 90 fL (ref 79–97)
Monocytes Absolute: 0.5 x10E3/uL (ref 0.1–0.9)
Monocytes: 9 %
Neutrophils Absolute: 2.4 x10E3/uL (ref 1.4–7.0)
Neutrophils: 48 %
Platelets: 252 x10E3/uL (ref 150–450)
RBC: 4.73 x10E6/uL (ref 3.77–5.28)
RDW: 11.4 % — ABNORMAL LOW (ref 11.7–15.4)
WBC: 5.2 x10E3/uL (ref 3.4–10.8)

## 2024-09-01 LAB — CMP14+EGFR
ALT: 15 IU/L (ref 0–32)
AST: 17 IU/L (ref 0–40)
Albumin: 4.4 g/dL (ref 3.9–4.9)
Alkaline Phosphatase: 52 IU/L (ref 41–116)
BUN/Creatinine Ratio: 15 (ref 9–23)
BUN: 10 mg/dL (ref 6–20)
Bilirubin Total: 0.2 mg/dL (ref 0.0–1.2)
CO2: 22 mmol/L (ref 20–29)
Calcium: 10.6 mg/dL — ABNORMAL HIGH (ref 8.7–10.2)
Chloride: 104 mmol/L (ref 96–106)
Creatinine, Ser: 0.67 mg/dL (ref 0.57–1.00)
Globulin, Total: 3.1 g/dL (ref 1.5–4.5)
Glucose: 95 mg/dL (ref 70–99)
Potassium: 4.1 mmol/L (ref 3.5–5.2)
Sodium: 140 mmol/L (ref 134–144)
Total Protein: 7.5 g/dL (ref 6.0–8.5)
eGFR: 120 mL/min/1.73

## 2024-09-01 LAB — TESTOSTERONE, FREE, TOTAL, SHBG
Sex Hormone Binding: 25.7 nmol/L (ref 24.6–122.0)
Testosterone, Free: 2 pg/mL (ref 0.0–4.2)
Testosterone: 52 ng/dL (ref 8–60)

## 2024-09-03 NOTE — Progress Notes (Signed)
 Hi Kerekes, your calcium is still a little elevated. Next time we do labs I would like to check a Parathyroid hormone level.

## 2024-09-06 ENCOUNTER — Telehealth: Payer: Self-pay | Admitting: Pharmacy Technician

## 2024-09-06 ENCOUNTER — Other Ambulatory Visit (HOSPITAL_COMMUNITY): Payer: Self-pay

## 2024-09-06 NOTE — Telephone Encounter (Signed)
 Pharmacy Patient Advocate Encounter   Received notification from [facility] KEY that prior authorization for Testosterone  1.62% gel is required/requested.   Insurance verification completed.   The patient is insured through [employer] [place] .   Per test claim: PA required; PA submitted to above mentioned insurance via Latent Key/confirmation #/EOC [id number] Status is pending

## 2024-09-10 ENCOUNTER — Other Ambulatory Visit (HOSPITAL_COMMUNITY): Payer: Self-pay

## 2024-09-10 NOTE — Telephone Encounter (Signed)
 Pharmacy Patient Advocate Encounter  Received notification from [employer] [place]  that Prior Authorization for Testosterone  1.62% gel has been APPROVED from 08/07/2024 to 09/06/2025. Unable to obtain price due to refill too soon rejection, last fill date 09/02/2024 next available fill date02/25/2026.   PA #/Case ID/Reference #: [id number]
